# Patient Record
Sex: Female | Born: 1935 | Race: White | Hispanic: No | Smoking: Former smoker
Health system: Southern US, Community
[De-identification: ages and names within clinical notes are randomized; demographics above are authoritative.]

## PROBLEM LIST (undated history)

## (undated) DIAGNOSIS — E215 Disorder of parathyroid gland, unspecified: Secondary | ICD-10-CM

## (undated) DIAGNOSIS — M109 Gout, unspecified: Secondary | ICD-10-CM

## (undated) DIAGNOSIS — R011 Cardiac murmur, unspecified: Secondary | ICD-10-CM

## (undated) DIAGNOSIS — R0602 Shortness of breath: Secondary | ICD-10-CM

## (undated) DIAGNOSIS — Z9089 Acquired absence of other organs: Secondary | ICD-10-CM

## (undated) DIAGNOSIS — Z9049 Acquired absence of other specified parts of digestive tract: Secondary | ICD-10-CM

## (undated) DIAGNOSIS — I10 Essential (primary) hypertension: Secondary | ICD-10-CM

## (undated) DIAGNOSIS — N816 Rectocele: Secondary | ICD-10-CM

## (undated) DIAGNOSIS — Z8719 Personal history of other diseases of the digestive system: Secondary | ICD-10-CM

## (undated) DIAGNOSIS — Z905 Acquired absence of kidney: Secondary | ICD-10-CM

## (undated) DIAGNOSIS — Z992 Dependence on renal dialysis: Secondary | ICD-10-CM

## (undated) DIAGNOSIS — L988 Other specified disorders of the skin and subcutaneous tissue: Secondary | ICD-10-CM

## (undated) DIAGNOSIS — N186 End stage renal disease: Secondary | ICD-10-CM

## (undated) DIAGNOSIS — Z90710 Acquired absence of both cervix and uterus: Secondary | ICD-10-CM

## (undated) DIAGNOSIS — K219 Gastro-esophageal reflux disease without esophagitis: Secondary | ICD-10-CM

## (undated) DIAGNOSIS — C801 Malignant (primary) neoplasm, unspecified: Secondary | ICD-10-CM

## (undated) HISTORY — DX: Other specified disorders of the skin and subcutaneous tissue: L98.8

## (undated) HISTORY — DX: Rectocele: N81.6

## (undated) HISTORY — PX: DG AV DIALYSIS GRAFT DECLOT OR: HXRAD813

## (undated) HISTORY — DX: Acquired absence of other organs: Z90.89

## (undated) HISTORY — PX: RECTOCELE REPAIR: SHX761

## (undated) HISTORY — DX: Acquired absence of other specified parts of digestive tract: Z90.49

## (undated) HISTORY — PX: TONSILLECTOMY: SUR1361

## (undated) HISTORY — DX: Dependence on renal dialysis: Z99.2

## (undated) HISTORY — DX: End stage renal disease: N18.6

## (undated) HISTORY — DX: Acquired absence of kidney: Z90.5

## (undated) HISTORY — DX: Essential (primary) hypertension: I10

## (undated) HISTORY — DX: Disorder of parathyroid gland, unspecified: E21.5

## (undated) HISTORY — DX: Gastro-esophageal reflux disease without esophagitis: K21.9

## (undated) HISTORY — DX: Acquired absence of both cervix and uterus: Z90.710

## (undated) HISTORY — PX: EYE SURGERY: SHX253

---
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
## 1988-09-13 HISTORY — PX: ABDOMINAL HYSTERECTOMY: SHX81

## 1997-12-12 ENCOUNTER — Other Ambulatory Visit: Admission: RE | Admit: 1997-12-12 | Discharge: 1997-12-12 | Payer: Self-pay | Admitting: Nephrology

## 1997-12-19 ENCOUNTER — Ambulatory Visit (HOSPITAL_COMMUNITY): Admission: RE | Admit: 1997-12-19 | Discharge: 1997-12-19 | Payer: Self-pay | Admitting: Nephrology

## 1998-01-07 ENCOUNTER — Other Ambulatory Visit: Admission: RE | Admit: 1998-01-07 | Discharge: 1998-01-07 | Payer: Self-pay | Admitting: Nephrology

## 1998-01-09 ENCOUNTER — Other Ambulatory Visit: Admission: RE | Admit: 1998-01-09 | Discharge: 1998-01-09 | Payer: Self-pay | Admitting: Nephrology

## 1998-01-09 ENCOUNTER — Ambulatory Visit (HOSPITAL_COMMUNITY): Admission: RE | Admit: 1998-01-09 | Discharge: 1998-01-09 | Payer: Self-pay | Admitting: Nephrology

## 1998-02-03 ENCOUNTER — Ambulatory Visit (HOSPITAL_COMMUNITY): Admission: RE | Admit: 1998-02-03 | Discharge: 1998-02-03 | Payer: Self-pay | Admitting: Nephrology

## 2001-01-27 ENCOUNTER — Encounter: Payer: Self-pay | Admitting: Nephrology

## 2001-01-27 ENCOUNTER — Ambulatory Visit (HOSPITAL_COMMUNITY): Admission: RE | Admit: 2001-01-27 | Discharge: 2001-01-27 | Payer: Self-pay | Admitting: Nephrology

## 2001-12-06 ENCOUNTER — Other Ambulatory Visit: Admission: RE | Admit: 2001-12-06 | Discharge: 2001-12-06 | Payer: Self-pay | Admitting: Obstetrics and Gynecology

## 2002-02-13 ENCOUNTER — Ambulatory Visit (HOSPITAL_COMMUNITY): Admission: RE | Admit: 2002-02-13 | Discharge: 2002-02-13 | Payer: Self-pay | Admitting: Internal Medicine

## 2002-04-05 ENCOUNTER — Ambulatory Visit (HOSPITAL_COMMUNITY): Admission: RE | Admit: 2002-04-05 | Discharge: 2002-04-05 | Payer: Self-pay | Admitting: Family Medicine

## 2002-04-05 ENCOUNTER — Encounter: Payer: Self-pay | Admitting: Family Medicine

## 2002-04-12 ENCOUNTER — Ambulatory Visit (HOSPITAL_COMMUNITY): Admission: RE | Admit: 2002-04-12 | Discharge: 2002-04-12 | Payer: Self-pay | Admitting: Family Medicine

## 2002-04-12 ENCOUNTER — Encounter: Payer: Self-pay | Admitting: Family Medicine

## 2002-04-17 ENCOUNTER — Ambulatory Visit (HOSPITAL_COMMUNITY): Admission: RE | Admit: 2002-04-17 | Discharge: 2002-04-17 | Payer: Self-pay | Admitting: Family Medicine

## 2002-04-17 ENCOUNTER — Encounter: Payer: Self-pay | Admitting: Family Medicine

## 2002-05-31 ENCOUNTER — Encounter (HOSPITAL_COMMUNITY): Admission: RE | Admit: 2002-05-31 | Discharge: 2002-06-30 | Payer: Self-pay | Admitting: Family Medicine

## 2002-06-07 ENCOUNTER — Other Ambulatory Visit: Admission: RE | Admit: 2002-06-07 | Discharge: 2002-06-07 | Payer: Self-pay | Admitting: Dermatology

## 2002-07-26 ENCOUNTER — Encounter (HOSPITAL_COMMUNITY): Admission: RE | Admit: 2002-07-26 | Discharge: 2002-08-25 | Payer: Self-pay | Admitting: Family Medicine

## 2002-09-24 ENCOUNTER — Encounter (HOSPITAL_COMMUNITY): Admission: RE | Admit: 2002-09-24 | Discharge: 2002-10-24 | Payer: Self-pay | Admitting: Family Medicine

## 2002-11-19 ENCOUNTER — Encounter (HOSPITAL_COMMUNITY): Admission: RE | Admit: 2002-11-19 | Discharge: 2002-12-19 | Payer: Self-pay | Admitting: Family Medicine

## 2003-01-14 ENCOUNTER — Encounter (HOSPITAL_COMMUNITY): Admission: RE | Admit: 2003-01-14 | Discharge: 2003-02-13 | Payer: Self-pay | Admitting: Family Medicine

## 2003-03-11 ENCOUNTER — Encounter (HOSPITAL_COMMUNITY): Admission: RE | Admit: 2003-03-11 | Discharge: 2003-04-10 | Payer: Self-pay | Admitting: Nephrology

## 2003-05-06 ENCOUNTER — Encounter (HOSPITAL_COMMUNITY): Admission: RE | Admit: 2003-05-06 | Discharge: 2003-06-05 | Payer: Self-pay | Admitting: Nephrology

## 2003-07-01 ENCOUNTER — Encounter (HOSPITAL_COMMUNITY): Admission: RE | Admit: 2003-07-01 | Discharge: 2003-07-31 | Payer: Self-pay | Admitting: Nephrology

## 2003-12-16 ENCOUNTER — Ambulatory Visit (HOSPITAL_COMMUNITY): Admission: RE | Admit: 2003-12-16 | Discharge: 2003-12-16 | Payer: Self-pay | Admitting: Vascular Surgery

## 2003-12-31 ENCOUNTER — Encounter (HOSPITAL_COMMUNITY): Admission: RE | Admit: 2003-12-31 | Discharge: 2004-01-30 | Payer: Self-pay | Admitting: Nephrology

## 2004-04-22 ENCOUNTER — Ambulatory Visit (HOSPITAL_COMMUNITY): Admission: RE | Admit: 2004-04-22 | Discharge: 2004-04-22 | Payer: Self-pay | Admitting: Family Medicine

## 2004-08-31 ENCOUNTER — Inpatient Hospital Stay (HOSPITAL_COMMUNITY): Admission: AD | Admit: 2004-08-31 | Discharge: 2004-09-02 | Payer: Self-pay | Admitting: Family Medicine

## 2004-09-01 ENCOUNTER — Ambulatory Visit: Payer: Self-pay | Admitting: Cardiology

## 2005-07-23 ENCOUNTER — Ambulatory Visit (HOSPITAL_COMMUNITY): Admission: RE | Admit: 2005-07-23 | Discharge: 2005-07-23 | Payer: Self-pay | Admitting: Nephrology

## 2006-03-02 ENCOUNTER — Ambulatory Visit: Payer: Self-pay | Admitting: Internal Medicine

## 2006-03-11 ENCOUNTER — Ambulatory Visit: Payer: Self-pay

## 2006-03-11 ENCOUNTER — Encounter: Payer: Self-pay | Admitting: Cardiology

## 2006-03-11 ENCOUNTER — Ambulatory Visit: Payer: Self-pay | Admitting: Cardiology

## 2006-04-08 ENCOUNTER — Ambulatory Visit (HOSPITAL_COMMUNITY): Admission: RE | Admit: 2006-04-08 | Discharge: 2006-04-08 | Payer: Self-pay | Admitting: Nephrology

## 2006-07-11 ENCOUNTER — Ambulatory Visit (HOSPITAL_COMMUNITY): Admission: RE | Admit: 2006-07-11 | Discharge: 2006-07-11 | Payer: Self-pay | Admitting: Neurology

## 2006-07-25 ENCOUNTER — Ambulatory Visit (HOSPITAL_COMMUNITY): Admission: RE | Admit: 2006-07-25 | Discharge: 2006-07-25 | Payer: Self-pay | Admitting: Nephrology

## 2006-09-13 HISTORY — PX: CHOLECYSTECTOMY: SHX55

## 2006-10-12 ENCOUNTER — Ambulatory Visit (HOSPITAL_COMMUNITY): Admission: RE | Admit: 2006-10-12 | Discharge: 2006-10-12 | Payer: Self-pay | Admitting: Nephrology

## 2006-12-16 ENCOUNTER — Ambulatory Visit: Payer: Self-pay | Admitting: Internal Medicine

## 2007-01-09 ENCOUNTER — Encounter: Payer: Self-pay | Admitting: Internal Medicine

## 2007-01-09 ENCOUNTER — Ambulatory Visit (HOSPITAL_COMMUNITY): Admission: RE | Admit: 2007-01-09 | Discharge: 2007-01-09 | Payer: Self-pay | Admitting: Internal Medicine

## 2007-02-11 ENCOUNTER — Ambulatory Visit (HOSPITAL_COMMUNITY): Admission: RE | Admit: 2007-02-11 | Discharge: 2007-02-11 | Payer: Self-pay | Admitting: Interventional Radiology

## 2007-02-14 ENCOUNTER — Ambulatory Visit (HOSPITAL_COMMUNITY): Admission: RE | Admit: 2007-02-14 | Discharge: 2007-02-14 | Payer: Self-pay | Admitting: Nephrology

## 2007-03-08 ENCOUNTER — Ambulatory Visit (HOSPITAL_COMMUNITY): Admission: RE | Admit: 2007-03-08 | Discharge: 2007-03-08 | Payer: Self-pay | Admitting: Urology

## 2007-05-05 ENCOUNTER — Ambulatory Visit: Payer: Self-pay | Admitting: Vascular Surgery

## 2007-06-07 ENCOUNTER — Encounter: Payer: Self-pay | Admitting: Internal Medicine

## 2008-03-07 ENCOUNTER — Ambulatory Visit (HOSPITAL_COMMUNITY): Admission: RE | Admit: 2008-03-07 | Discharge: 2008-03-07 | Payer: Self-pay | Admitting: Surgery

## 2008-05-09 ENCOUNTER — Encounter: Payer: Self-pay | Admitting: Internal Medicine

## 2008-05-16 ENCOUNTER — Encounter: Payer: Self-pay | Admitting: Internal Medicine

## 2008-05-23 ENCOUNTER — Encounter: Payer: Self-pay | Admitting: Internal Medicine

## 2008-05-27 ENCOUNTER — Encounter: Payer: Self-pay | Admitting: Internal Medicine

## 2008-06-10 ENCOUNTER — Encounter: Payer: Self-pay | Admitting: Internal Medicine

## 2008-08-01 ENCOUNTER — Encounter: Payer: Self-pay | Admitting: Internal Medicine

## 2008-08-07 ENCOUNTER — Ambulatory Visit (HOSPITAL_COMMUNITY): Admission: RE | Admit: 2008-08-07 | Discharge: 2008-08-07 | Payer: Self-pay | Admitting: Nephrology

## 2008-08-08 ENCOUNTER — Ambulatory Visit (HOSPITAL_COMMUNITY): Admission: RE | Admit: 2008-08-08 | Discharge: 2008-08-08 | Payer: Self-pay | Admitting: Nephrology

## 2008-08-23 ENCOUNTER — Ambulatory Visit: Payer: Self-pay | Admitting: Vascular Surgery

## 2008-08-28 ENCOUNTER — Ambulatory Visit (HOSPITAL_COMMUNITY): Admission: RE | Admit: 2008-08-28 | Discharge: 2008-08-28 | Payer: Self-pay | Admitting: Vascular Surgery

## 2008-08-28 ENCOUNTER — Ambulatory Visit: Payer: Self-pay | Admitting: Vascular Surgery

## 2008-08-29 ENCOUNTER — Encounter: Payer: Self-pay | Admitting: Internal Medicine

## 2008-09-14 ENCOUNTER — Encounter: Payer: Self-pay | Admitting: Internal Medicine

## 2008-09-26 ENCOUNTER — Telehealth: Payer: Self-pay | Admitting: Internal Medicine

## 2008-10-07 ENCOUNTER — Ambulatory Visit: Payer: Self-pay | Admitting: Internal Medicine

## 2008-10-07 DIAGNOSIS — R112 Nausea with vomiting, unspecified: Secondary | ICD-10-CM

## 2008-10-07 DIAGNOSIS — R1115 Cyclical vomiting syndrome unrelated to migraine: Secondary | ICD-10-CM

## 2008-10-09 ENCOUNTER — Ambulatory Visit (HOSPITAL_COMMUNITY): Admission: RE | Admit: 2008-10-09 | Discharge: 2008-10-09 | Payer: Self-pay | Admitting: Nephrology

## 2008-10-28 ENCOUNTER — Encounter (HOSPITAL_COMMUNITY): Admission: RE | Admit: 2008-10-28 | Discharge: 2008-11-27 | Payer: Self-pay | Admitting: Internal Medicine

## 2008-12-19 ENCOUNTER — Ambulatory Visit: Payer: Self-pay | Admitting: Cardiology

## 2008-12-27 ENCOUNTER — Ambulatory Visit: Payer: Self-pay | Admitting: Cardiology

## 2008-12-27 ENCOUNTER — Encounter: Payer: Self-pay | Admitting: Cardiology

## 2008-12-27 ENCOUNTER — Encounter (HOSPITAL_COMMUNITY): Admission: RE | Admit: 2008-12-27 | Discharge: 2009-01-26 | Payer: Self-pay | Admitting: Cardiology

## 2008-12-28 ENCOUNTER — Ambulatory Visit (HOSPITAL_COMMUNITY): Admission: RE | Admit: 2008-12-28 | Discharge: 2008-12-28 | Payer: Self-pay | Admitting: Vascular Surgery

## 2008-12-28 ENCOUNTER — Ambulatory Visit: Payer: Self-pay | Admitting: Vascular Surgery

## 2009-01-08 ENCOUNTER — Ambulatory Visit: Payer: Self-pay | Admitting: Cardiology

## 2009-01-13 ENCOUNTER — Ambulatory Visit (HOSPITAL_COMMUNITY): Admission: RE | Admit: 2009-01-13 | Discharge: 2009-01-13 | Payer: Self-pay | Admitting: Family Medicine

## 2009-01-22 ENCOUNTER — Ambulatory Visit (HOSPITAL_COMMUNITY): Admission: RE | Admit: 2009-01-22 | Discharge: 2009-01-22 | Payer: Self-pay | Admitting: Family Medicine

## 2009-01-24 ENCOUNTER — Ambulatory Visit: Payer: Self-pay | Admitting: Internal Medicine

## 2009-01-24 DIAGNOSIS — K589 Irritable bowel syndrome without diarrhea: Secondary | ICD-10-CM

## 2009-02-19 ENCOUNTER — Encounter: Payer: Self-pay | Admitting: Internal Medicine

## 2009-04-23 ENCOUNTER — Encounter (HOSPITAL_COMMUNITY): Admission: RE | Admit: 2009-04-23 | Discharge: 2009-05-23 | Payer: Self-pay | Admitting: Family Medicine

## 2009-05-07 ENCOUNTER — Encounter (INDEPENDENT_AMBULATORY_CARE_PROVIDER_SITE_OTHER): Payer: Self-pay | Admitting: General Surgery

## 2009-05-08 ENCOUNTER — Inpatient Hospital Stay (HOSPITAL_COMMUNITY): Admission: RE | Admit: 2009-05-08 | Discharge: 2009-05-10 | Payer: Self-pay | Admitting: General Surgery

## 2009-07-07 HISTORY — PX: PARATHYROIDECTOMY: SHX19

## 2009-08-13 ENCOUNTER — Ambulatory Visit (HOSPITAL_COMMUNITY): Admission: RE | Admit: 2009-08-13 | Discharge: 2009-08-13 | Payer: Self-pay | Admitting: Nephrology

## 2009-08-20 ENCOUNTER — Ambulatory Visit (HOSPITAL_COMMUNITY): Admission: RE | Admit: 2009-08-20 | Discharge: 2009-08-20 | Payer: Self-pay | Admitting: Family Medicine

## 2010-06-22 ENCOUNTER — Ambulatory Visit: Payer: Self-pay | Admitting: Internal Medicine

## 2010-09-01 ENCOUNTER — Ambulatory Visit (HOSPITAL_COMMUNITY)
Admission: RE | Admit: 2010-09-01 | Discharge: 2010-09-01 | Payer: Self-pay | Source: Home / Self Care | Attending: Family Medicine | Admitting: Family Medicine

## 2010-10-04 ENCOUNTER — Encounter: Payer: Self-pay | Admitting: Nephrology

## 2010-11-26 ENCOUNTER — Ambulatory Visit (INDEPENDENT_AMBULATORY_CARE_PROVIDER_SITE_OTHER): Payer: Medicare Other | Admitting: Otolaryngology

## 2010-11-26 DIAGNOSIS — H814 Vertigo of central origin: Secondary | ICD-10-CM

## 2010-11-26 DIAGNOSIS — R42 Dizziness and giddiness: Secondary | ICD-10-CM

## 2010-11-26 DIAGNOSIS — H6120 Impacted cerumen, unspecified ear: Secondary | ICD-10-CM

## 2010-12-19 LAB — POCT I-STAT 4, (NA,K, GLUC, HGB,HCT)
HCT: 31 % — ABNORMAL LOW (ref 36.0–46.0)
Hemoglobin: 10.5 g/dL — ABNORMAL LOW (ref 12.0–15.0)

## 2010-12-19 LAB — RENAL FUNCTION PANEL
Albumin: 3 g/dL — ABNORMAL LOW (ref 3.5–5.2)
BUN: 28 mg/dL — ABNORMAL HIGH (ref 6–23)
BUN: 31 mg/dL — ABNORMAL HIGH (ref 6–23)
BUN: 32 mg/dL — ABNORMAL HIGH (ref 6–23)
BUN: 36 mg/dL — ABNORMAL HIGH (ref 6–23)
BUN: 37 mg/dL — ABNORMAL HIGH (ref 6–23)
CO2: 23 mEq/L (ref 19–32)
CO2: 25 mEq/L (ref 19–32)
CO2: 25 mEq/L (ref 19–32)
CO2: 28 mEq/L (ref 19–32)
Calcium: 6.6 mg/dL — ABNORMAL LOW (ref 8.4–10.5)
Calcium: 8 mg/dL — ABNORMAL LOW (ref 8.4–10.5)
Calcium: 8 mg/dL — ABNORMAL LOW (ref 8.4–10.5)
Chloride: 95 mEq/L — ABNORMAL LOW (ref 96–112)
Chloride: 96 mEq/L (ref 96–112)
Chloride: 97 mEq/L (ref 96–112)
Chloride: 99 mEq/L (ref 96–112)
Creatinine, Ser: 4.86 mg/dL — ABNORMAL HIGH (ref 0.4–1.2)
Creatinine, Ser: 6.45 mg/dL — ABNORMAL HIGH (ref 0.4–1.2)
Creatinine, Ser: 8.01 mg/dL — ABNORMAL HIGH (ref 0.4–1.2)
Creatinine, Ser: 8.18 mg/dL — ABNORMAL HIGH (ref 0.4–1.2)
Glucose, Bld: 100 mg/dL — ABNORMAL HIGH (ref 70–99)
Glucose, Bld: 116 mg/dL — ABNORMAL HIGH (ref 70–99)
Glucose, Bld: 132 mg/dL — ABNORMAL HIGH (ref 70–99)
Glucose, Bld: 144 mg/dL — ABNORMAL HIGH (ref 70–99)
Glucose, Bld: 98 mg/dL (ref 70–99)
Phosphorus: 2.8 mg/dL (ref 2.3–4.6)
Phosphorus: 5.1 mg/dL — ABNORMAL HIGH (ref 2.3–4.6)
Potassium: 4.4 mEq/L (ref 3.5–5.1)
Potassium: 4.5 mEq/L (ref 3.5–5.1)
Sodium: 132 mEq/L — ABNORMAL LOW (ref 135–145)
Sodium: 135 mEq/L (ref 135–145)

## 2010-12-19 LAB — CBC
HCT: 24.5 % — ABNORMAL LOW (ref 36.0–46.0)
HCT: 25.5 % — ABNORMAL LOW (ref 36.0–46.0)
HCT: 25.7 % — ABNORMAL LOW (ref 36.0–46.0)
HCT: 34 % — ABNORMAL LOW (ref 36.0–46.0)
Hemoglobin: 8.7 g/dL — ABNORMAL LOW (ref 12.0–15.0)
MCHC: 33.7 g/dL (ref 30.0–36.0)
MCHC: 33.9 g/dL (ref 30.0–36.0)
MCHC: 34.4 g/dL (ref 30.0–36.0)
MCV: 95.2 fL (ref 78.0–100.0)
MCV: 95.6 fL (ref 78.0–100.0)
MCV: 96 fL (ref 78.0–100.0)
Platelets: 110 10*3/uL — ABNORMAL LOW (ref 150–400)
Platelets: 116 10*3/uL — ABNORMAL LOW (ref 150–400)
RBC: 2.67 MIL/uL — ABNORMAL LOW (ref 3.87–5.11)
RBC: 3.24 MIL/uL — ABNORMAL LOW (ref 3.87–5.11)
RBC: 3.57 MIL/uL — ABNORMAL LOW (ref 3.87–5.11)
RDW: 15.7 % — ABNORMAL HIGH (ref 11.5–15.5)
RDW: 15.9 % — ABNORMAL HIGH (ref 11.5–15.5)
WBC: 13.1 10*3/uL — ABNORMAL HIGH (ref 4.0–10.5)
WBC: 7.6 10*3/uL (ref 4.0–10.5)
WBC: 8.1 10*3/uL (ref 4.0–10.5)
WBC: 8.7 10*3/uL (ref 4.0–10.5)

## 2010-12-19 LAB — COMPREHENSIVE METABOLIC PANEL
ALT: 54 U/L — ABNORMAL HIGH (ref 0–35)
AST: 45 U/L — ABNORMAL HIGH (ref 0–37)
CO2: 24 mEq/L (ref 19–32)
Calcium: 8 mg/dL — ABNORMAL LOW (ref 8.4–10.5)
Chloride: 99 mEq/L (ref 96–112)
GFR calc Af Amer: 6 mL/min — ABNORMAL LOW (ref >60)
GFR calc non Af Amer: 5 mL/min — ABNORMAL LOW (ref >60)
Potassium: 5.6 mEq/L — ABNORMAL HIGH (ref 3.5–5.1)
Sodium: 135 mEq/L (ref 135–145)
Total Bilirubin: 0.9 mg/dL (ref 0.3–1.2)

## 2010-12-19 LAB — BASIC METABOLIC PANEL
BUN: 29 mg/dL — ABNORMAL HIGH (ref 6–23)
CO2: 27 mEq/L (ref 19–32)
Chloride: 101 mEq/L (ref 96–112)
GFR calc Af Amer: 7 mL/min — ABNORMAL LOW (ref >60)
Potassium: 4.9 mEq/L (ref 3.5–5.1)

## 2010-12-19 LAB — DIFFERENTIAL
Eosinophils Absolute: 0.2 10*3/uL (ref 0.0–0.7)
Eosinophils Relative: 2 % (ref 0–5)
Lymphs Abs: 1.8 10*3/uL (ref 0.7–4.0)
Monocytes Relative: 8 % (ref 3–12)

## 2010-12-19 LAB — GLUCOSE, CAPILLARY: Glucose-Capillary: 120 mg/dL — ABNORMAL HIGH (ref 70–99)

## 2010-12-19 LAB — HEPATITIS B SURFACE ANTIGEN: Hepatitis B Surface Ag: NEGATIVE

## 2010-12-23 LAB — POCT I-STAT 4, (NA,K, GLUC, HGB,HCT)
Glucose, Bld: 89 mg/dL (ref 70–99)
HCT: 44 % (ref 36.0–46.0)
Potassium: 4.7 mEq/L (ref 3.5–5.1)
Sodium: 134 mEq/L — ABNORMAL LOW (ref 135–145)

## 2010-12-25 ENCOUNTER — Other Ambulatory Visit (HOSPITAL_COMMUNITY): Payer: Self-pay | Admitting: Nephrology

## 2010-12-25 DIAGNOSIS — N186 End stage renal disease: Secondary | ICD-10-CM

## 2010-12-26 ENCOUNTER — Other Ambulatory Visit: Payer: Self-pay | Admitting: Nephrology

## 2010-12-26 ENCOUNTER — Ambulatory Visit (HOSPITAL_COMMUNITY)
Admission: RE | Admit: 2010-12-26 | Discharge: 2010-12-26 | Disposition: A | Discharge status: Home / Self Care | Payer: Medicare Other | Source: Ambulatory Visit | Attending: Nephrology | Admitting: Nephrology

## 2010-12-26 DIAGNOSIS — T8249XA Other complication of vascular dialysis catheter, initial encounter: Secondary | ICD-10-CM

## 2010-12-26 DIAGNOSIS — T82898A Other specified complication of vascular prosthetic devices, implants and grafts, initial encounter: Secondary | ICD-10-CM | POA: Insufficient documentation

## 2010-12-26 DIAGNOSIS — N186 End stage renal disease: Secondary | ICD-10-CM | POA: Insufficient documentation

## 2010-12-26 DIAGNOSIS — Y832 Surgical operation with anastomosis, bypass or graft as the cause of abnormal reaction of the patient, or of later complication, without mention of misadventure at the time of the procedure: Secondary | ICD-10-CM | POA: Insufficient documentation

## 2010-12-26 MED ORDER — IOHEXOL 300 MG/ML  SOLN
100.0000 mL | Freq: Once | INTRAMUSCULAR | Status: AC | PRN
  Administered 2010-12-26: 75 mL via INTRAVENOUS

## 2011-01-04 ENCOUNTER — Ambulatory Visit (HOSPITAL_COMMUNITY): Payer: Medicare Other

## 2011-01-26 NOTE — Discharge Summary (Signed)
NAMEABRYANA, Julia Love                ACCOUNT NO.:  [id number]   MEDICAL RECORD NO.:  [id number]           PATIENT TYPE:   LOCATION:                                 FACILITY:   PHYSICIAN:  Anselm Pancoast. Weatherly, M.D.DATE OF BIRTH:  1928-12-18   DATE OF ADMISSION:  05/06/2009  DATE OF DISCHARGE:  05/08/2009                               DISCHARGE SUMMARY   DISCHARGE DIAGNOSES:  1. Chronic renal failure.  2. Secondary hyperparathyroidism.   OPERATION:  Total parathyroidectomy and transplant to the left forearm,  general anesthesia, also hemodialysis.   HISTORY:  Julia Love is a 77 year old Caucasian female who is dialyzed  in [place], and she was referred to me by Dr. Caryn Section or Dr. Eliott Nine for  secondary hyperparathyroidism and for a parathyroidectomy and  transplant.  The patient is dialyzed in [place].  She lives in [place]  in the [facility], and her PTH level was about 1030 in  July 2010.  The patient is dialyzed on Tuesday, Thursday, and Saturday  and was admitted for surgery on Friday.   CHRONIC MEDICATIONS:  She is on:  1. Allopurinol 100 mg p.r.n.  2. Bentyl 10 mg p.r.n.  3. Fosrenol 2 tablets t.i.d. with meals.  4. Lisinopril 40 mg daily.  5. Spiriva 1 inhalation daily.  6. Prilosec 20.  7. Chlorzoxazone 500 mg p.r.n. spasm.   The patient lives by herself but accompanied by her daughter and was  taken to surgery where I did a neck exploration.  Dr. Donell Beers assisted  and ultimately 5 parathyroid glands were found, 3 on the left, 2 on the  right.  There was 1 little nodule that looked like a parathyroid to [place].  It was actually thyroid tissue and there was another little firm nodule  in the thyroid.  These were removed and were just benign thyroid tissue.  I transplanted 10 little pieces to the left forearm through a separate  incision, and postoperatively the patient's calcium dropped to  approximately 7 on the morning following her surgery and then over  the  next 24 hours the calcium started rising the low being 6.8 on the  morning following her surgery, and she desired to stay in the hospital  over the weekend mainly because of her age of 54 and was seen by the  hemodialysis doctors and then was ready for discharge after her  hemodialysis on Monday morning.  Her incision appears to be healing  nicely, and her serum calcium was 7 and stable on the morning of  surgery, and she will continue on her chronic medications and is  scheduled for her next hemodialysis on  Wednesday at the [facility].  She was taking Darvocet-N 100 for  her pain, and the staples will be removed from her forearm in the office  in approximately 1 week.  Her incision is healing nicely, and she had  good voice.  No evidence of any significant hematoma or wound problems  at the time of her discharge.      Anselm Pancoast. Zachery Dakins, M.D.  Electronically Signed  WJW/MEDQ  D:  05/28/2009  T:  05/29/2009  Job:  [id number]

## 2011-01-26 NOTE — Assessment & Plan Note (Signed)
[facility]                       [place] CARDIOLOGY OFFICE NOTE   LAMIRACLE, CHAIDEZ                       MRN:          [id number]  DATE:01/08/2009                            DOB:          May 02, 1929    Ms. Julia Love comes back in today for generalized dizziness, weakness, and  fatigue.   She had had some dyspnea on exertion as well.   We performed an adenosine Myoview which showed an EF of 43% with no  ischemia.  She had mild hypokinesia of the lateral and inferior wall.  There was some breast attenuation.   She also had a 2-D echocardiogram which shows diastolic dysfunction with  moderate LVH, EF is 60-65%.  There was no significant valvular heart  disease.  Right-sided function appeared normal.  There was no evidence  of pulmonary hypertension.  There was no pericardial effusion.   She has significant problems of dropping her blood pressure with  dialysis.  She and her daughter confirms this today.  She holds her  medicines prior to dialysis.   Her meds are unchanged since her last visit.  Please refer to my initial  note as well as the maintenance medication list.   Blood pressure today is 148/76, pulse is 60 and regular, her weight is  157.  Neck shows no JVD.  Lungs were clear to auscultation and  percussion.  Heart reveals a regular rate and rhythm.  Abdominal exam is  soft.  Extremities, there were no significant edema.   ASSESSMENT AND PLAN:  1. Chronic diastolic congestive heart failure.  2. Hypertension with tendency to drop her pressure with dialysis.   At this point, I do not think increasing her medications would be of any  significant benefit and actually might be deleterious dropping her  pressure further.  She does complain of some orthostatic symptoms,  particularly after dialysis and even today after dialysis.  At this  point in time, I have made no changes.  We will plan on seeing her back  again on a p.r.n.  basis.     Thomas C. Daleen Squibb, MD, [facility]  Electronically Signed    TCW/MedQ  DD: 01/08/2009  DT: 01/08/2009  Job #: [id number]   cc:   Donna Bernard, M.D.

## 2011-01-26 NOTE — Consult Note (Signed)
Julia Love, Julia Love                ACCOUNT NO.:  [id number]   MEDICAL RECORD NO.:  [id number]          PATIENT TYPE:  REC   LOCATION:  REH                           FACILITY:  [facility]   PHYSICIAN:  James L. Deterding, M.D.DATE OF BIRTH:  June 03, 1929   DATE OF CONSULTATION:  05/07/2009  DATE OF DISCHARGE:                                 CONSULTATION   REFERRING PHYSICIAN:  Scott A. Gerda Diss, MD.   REASON FOR CONSULTATION:  Postoperative management and hemodialysis.   HISTORY OF PRESENT ILLNESS:  This is a 77 year old white female with end-  stage renal disease on hemodialysis who was admitted for scheduled  parathyroidectomy for secondary hyperparathyroid hormone due to end-  stage renal disease.  Per home records the patient's last PTH was 1030.7  on March 31, 2009 with calcium 10.2, phosphorus 4.5.  The patient is  dialyzed on Tuesday, Thursday, Saturday at [facility].   PAST MEDICAL HISTORY:  1. End-stage renal disease secondary to hypertension on hemodialysis      since November 2005.  2. Solitary left kidney with cystic changes.  3. Glucose intolerance, diet controlled.  4. Hypertension.  5. Peripheral vascular disease.  6. Gout.  7. Secondary hyperparathyroidism.  8. Anemia.  9. Vertigo.  10.History of uterine cancer.  11.COPD.  12.Diastolic CHF with EF 60-65%.   PAST SURGICAL HISTORY:  Right forearm graft August 22, 2008 with  revision on December 28, 2008.   SOCIAL HISTORY:  The patient lives by herself.  She has a 20 pack-year  smoking history.  She quit smoking 20 years ago.  The patient denies  alcohol or illicit drug use.   FAMILY HISTORY:  Noncontributory.   REVIEW OF SYSTEMS:  12 point review of systems negative.  Negative for  chest pain, abdominal pain, shortness of breath, paresthesias or spasms.   HOME MEDICATIONS:  1. Chlorzoxazone 500 mg p.r.n. spasm.  2. Prilosec 20 mg p.o. b.i.d.  3. Spiriva one inhalation daily.  4. Lisinopril 40 mg p.o.  daily.  5. Fosrenol 2 tablets with meals t.i.d., one tablet with snacks b.i.d.  6. Bentyl 10 mg p.r.n.  7. Allopurinol 100 mg p.r.n.  8. Meclizine 25 mg 1-2 tablets p.r.n.  9. Simvastatin 20 mg nightly.  10.Dialyvite once daily.  11.Aspirin 81 mg daily.  12.Amlodipine 5 mg daily.   PHYSICAL EXAMINATION:  VITAL SIGNS:  Temperature 98.2, heart rate 71,  blood pressure 102/71, respirations 22, O2 saturation 96% on room air.  GENERAL:  Alert and oriented x4.  No acute distress.Marland Kitchen  HEENT:  Patient speaking, swallowing without any difficulty.  Patient  with an anterior neck dressing status post thyroidectomy.  CARDIOVASCULAR:  Regular rate and rhythm.  No murmurs.  RESPIRATORY:  Respirations clear to auscultation bilaterally.  ABDOMEN:  Positive bowel sounds, soft, nondistended.  EXTREMITIES:  No edema.  Left arm in dressing, status post auto  implantation.  Patient with a right forearm graft which is patent.   LABORATORY STUDIES:  No labs.   ASSESSMENT/PLAN:  This is a 77 year old female admitted immediately  postoperatively status post parathyroidectomy with auto  implantation and  of parathyroid glands in the left forearm for secondary hyperparathyroid  hormone.  1. Postop day #0:  The patient is on Vicodin and Ancef  times one dose      per surgery.  We will monitor the patient's creatinine, monitor the      patient's potassium, calcium, magnesium now and obtain a renal      function panel every 12 hours to monitor for hypocalcemia.  We will      prophylactically increase the patient's calcium bath on      hemodialysis and add calcium binders to the patient's regimen.  2. End-stage renal disease on hemodialysis:  The patient is to get      hemodialysis as regularly scheduled on Tuesday, Thursday, Saturday.      We will continue the patient on Dialyvite  The patient usually on 3      hours at outpatient hemodialysis center but we will increase to      three and a half hours for  improved dialysis.  Would increase the      patient's calcium bath from 2 calcium prior to surgery to three and      a half calcium post surgically.  3. Secondary hyperparathyroidism.  The patient was on calcitriol 2 mcg      three times a week.  We will increase that now to 4 mcg three times      a week as she has post parathyroidectomy.  Fosrenol will be      discontinued and the patient will be changed to calcium acetate for      binders.  4. Hypertension.  The patient on lisinopril and amlodipine at home.      These will be held if the patient's blood pressure is currently low      postoperatively.  5. Gout.  The patient will be continued on home allopurinol.  6. Chronic obstructive pulmonary disease.  The patient will be      continued on Spiriva inhaler.  7. Vertigo.  We will currently hold the patient's meclizine unless      needed.  8. Anemia secondary to end-stage renal disease:  The patient on Epogen      5.4000 units three times a week on hemodialysis.  We will monitor      CBC for postoperative blood loss.  9. Glucose intolerance.  Patient with history of neuropathy as a      complication.  Patient is currently diet controlled.      Delbert Harness, MD  Electronically Signed     ______________________________  Llana Aliment. Deterding, M.D.    Corky Downs  D:  05/07/2009  T:  05/07/2009  Job:  [id number]

## 2011-01-26 NOTE — Assessment & Plan Note (Signed)
[facility]                       [place] CARDIOLOGY OFFICE NOTE   TANZANIA, BASHAM                       MRN:          [id number]  DATE:12/19/2008                            DOB:          [birth year]    Ms. Kutzer comes in today for evaluation of extreme fatigue and  shortness of breath.   She is now 77 years of age.  I last saw her in June 2007, for  palpitations and a history of hypertension.   She has been on dialysis for renal failure for 5 years.  She says this  is really starting to take a toll on her.  She was actually exhausted to  the point she has to sleep on day she is dialyzed.   She denies any chest tightness or pressure, though just walking to the  mailbox exhausted.  She has also had some lightheadedness which she says  is there most of the time.  It is really not after dialysis and it is  really not when she stands up.   We heard this over the phone, we decreased her lisinopril to 40 mg a day  from 80 mg a day.  She continues on amlodipine to 2.5 mg p.o. b.i.d.,  aspirin 81 mg a day.  She continues on allopurinol 100 mg daily,  dicyclomine 10 mg b.i.d. Fosrenol 1000 mg before meals and bedtime,  simvastatin 20 mg nightly, Prilosec over-the-counter 20 mg per day.   Her blood pressure today is 150/60, her pulse is 66 and regular, and  weight is 156.  She was 165 back in 2007.  She is chronically ill appearing, she is very pleasant, soft spoken,  alert, and oriented x3.  HEENT:  Unremarkable.  NECK:  Supple.  Carotid upstrokes are equal bilaterally without bruits.  There is no JVD.  Thyroid is not enlarged.  Trachea is midline.  CHEST:  Lungs are clear to auscultation and percussion.  HEART:  Regular rate and rhythm.  No gallop or rub.  She does have a  systolic murmur along left sternal border.  S2 splits.  I could hear no  diastolic component.  There does not appear to be a right ventricular  lift.  ABDOMEN:  Soft.  Good  bowel sounds.  Nondistended.  EXTREMITIES:  No significant edema.  Pulses are intact.  NEUROLOGIC:  Intact.  SKIN:  Ecchymosis, particularly of the upper extremities.   Electrocardiogram showed some baseline artifact just from her tremor.  She has some PVCs that are unifocal, but no acute changes since we last  did an EKG on her.  She does have some poor R-wave progression.  However, now that she did not appear to have in the past.  It could be  lead placement.   ASSESSMENT:  Extreme fatigue and shortness of breath, rule out occult or  silent anterior wall infarct based on her EKG findings.  With her  history of hypertension and dialysis, she certainly is high risk for  having coronary artery disease.  In addition, she is at high risk of  having diastolic dysfunction with left ventricular hypertrophy  as well.   PLAN:  1. A 2-D echocardiogram.  2. Adenosine rest stress Myoview.  3. Sit down and talk to me afterwards in a couple of weeks.      Thomas C. Daleen Squibb, MD, [facility]  Electronically Signed    TCW/MedQ  DD: 12/19/2008  DT: 12/20/2008  Job #: [id number]   cc:   Donna Bernard, M.D.

## 2011-01-26 NOTE — Op Note (Signed)
NAMEMARIACHRISTINA, Julia Love                ACCOUNT NO.:  [id number]   MEDICAL RECORD NO.:  [id number]          PATIENT TYPE:  AMB   LOCATION:  DAY                          FACILITY:  [facility]   PHYSICIAN:  Excell Seltzer. Annabell Howells, M.D.    DATE OF BIRTH:  Sep 12, 1929   DATE OF PROCEDURE:  03/08/2007  DATE OF DISCHARGE:                               OPERATIVE REPORT   PROCEDURES:  1. Cystoscopy.  2. Bilateral retrograde pyelograms with interpretation.   PREOPERATIVE DIAGNOSES:  1. Gross hematuria.  2. End-stage renal disease   POSTOPERATIVE DIAGNOSES:  1. Gross hematuria.  2. End-stage renal disease.  3. Without obvious source of bleeding.   SURGEON:  Excell Seltzer. Annabell Howells, M.D.   ANESTHESIA:  General.   COMPLICATIONS:  None.   INDICATIONS:  [name] is a 77 year old white female with end-stage  renal disease on dialysis who had gross hematuria earlier this month.  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  A urine cytology was done, which was negative.  An ultrasound and CT  revealed a left renal cyst but no other abnormalities other than a  possible small stone within the left kidney.  It was felt that  cystoscopy and retrograde pyelography was indicated.   FINDINGS AND PROCEDURE:  The patient was taken to the operating room,  Cipro was given, a general anesthetic was induced.  She was placed in  lithotomy position.  Her perineum and genitalia were prepped with  Betadine solution and she was draped in the usual sterile fashion.  Cystoscopy was performed using a 22-French scope and 12 and 70-degrees  lenses.  Examination revealed a normal urethra with the exception of a  small benign polyp.  The bladder wall was smooth and pale with no tumor,  stones or inflammation noted.  Ureteral orifices were unremarkable.   The right ureteral orifice was cannulated with a 5-French open-end  catheter and contrast was instilled.   The retrograde pyelogram revealed an atretic  right ureter that blind-  ended just above the pelvis consistent with a prior nephrectomy.   The left ureteral orifice was cannulated with a 5-French open-end  catheter.  Contrast was instilled in a retrograde fashion.   This study revealed an unremarkable ureter.  The internal collecting  system was delicate without filling defects.  No abnormalities were  noted.   The bladder was drained.  The cystoscope was removed.  The patient was  taken down from the lithotomy position.  Her anesthetic was reversed.  She was moved to the recovery room in stable condition.  There were no  complications.      Excell Seltzer. Annabell Howells, M.D.  Electronically Signed     JJW/MEDQ  D:  03/08/2007  T:  03/08/2007  Job:  [id number]   cc:   Donna Bernard, M.D.  Fax: [phone]

## 2011-01-26 NOTE — Assessment & Plan Note (Signed)
OFFICE VISIT   Julia Love, Julia Love  DOB:  August 04, 1929                                       08/23/2008  [id number]#:[id number]   The patient presents today for evaluation of IV access.  She is a very  pleasant white female well known to me from placement of a prior left  upper arm AV fistula 5 years ago.  She had recently undergone attempted  thrombolysis and stenting of this at [facility].  She,  unfortunately, had early thrombosis.  I am seeing her today for  evaluation of skin changes over the distal portion of her aneurysm on  the left and for planning for new graft.  She does have a right IJ  Diatek catheter which is functioning without difficulty.  She,  unfortunately, has nausea and vomiting today related to what she has  been told from her gastroenterologist is a stomach infection.   PHYSICAL EXAMINATION:  Her left arm does have occlusion of the fistula  from her mid upper arm proximally.  She does have a large aneurysmal  change over the vein at the antecubital space.  There is an area  overlying the puncture site for the thrombolysis and stenting that does  have an area of irritation.  The aneurysm at the antecubital space is  quite pulsatile due to proximal occlusion.  There is no actual skin  breakdown over this area.   I ultrasounded her basilic vein on the left and it is quite small, on  the right she does not have adequate cephalic vein by ultrasound for  fistula creation but does have a good basilic vein on the medial aspect  above her elbow.  I discussed options with the patient.  I recommended  that she undergo placement of a right forearm loop graft and also  excision of the aneurysm at the antecubital space.  She dialyzes on  Tuesday, Thursday, Saturday and we have scheduled this for Dr. Hart Rochester on  08/28/2008 at [facility].   Larina Earthly, M.D.  Electronically Signed   TFE/MEDQ  D:  08/23/2008  T:  08/26/2008  Job:   [id number]   cc:   Mindi Slicker. Lowell Guitar, M.D.  D. Oley Balm III, M.D.

## 2011-01-26 NOTE — Assessment & Plan Note (Signed)
OFFICE VISIT   Julia Love, Julia Love  DOB:  1929/05/20                                       05/05/2007  [id number]#:[id number]   The patient presents today for evaluation of AV access.  She is known to  me from prior AV fistula creation on 12/16/2003.  She has had good use  of this and has had several episodes of thrombosis requiring  thrombolysis intervention.  She has never had a revision surgically of  this.  Her most recent thrombolysis was in May of 2008.  She has  dialyzed successfully since then.  She reports that she has had no  difficulty with her dialysis and has had good flows.  She is questioning  why she is here.  I did call the [facility] and  reportedly have been asked to place a new access for [name].  They  report that she has been having adequate flows through her fistula.   PHYSICAL EXAMINATION:  Well-developed, well-nourished white female  appearing stated age, 64.  Blood pressure 155/71, pulse 81, respirations  18.  She does have a well-functioning left upper arm AV fistula.  She  does have some venous aneurysmal change in the distal upper arm.  There  is no skin breakdown over this.  She does have good development in size  all the way up to her shoulder.   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  I had discussed this  with [name].  Since she is having adequate dialysis with good flows  I would recommend that she continue this current treatment.  If she  should have progressive difficulty with poor flow and function I would  recommend that she have a formal shuntogram to determine if she could  have a revision of the proximal portion of her vein.  I did explain the  option of mobilizing this cephalic vein and transposing it to the  axillary  vein.  Since she and the Dialysis Center report adequate flow I would  recommend continued use of this  fistula as long as possible.  She  understands this and will see [place] on an as-needed basis.   Larina Earthly, M.D.  Electronically Signed   TFE/MEDQ  D:  05/05/2007  T:  05/08/2007  Job:  [id number]   cc:   [facility]  [facility]

## 2011-01-26 NOTE — Op Note (Signed)
Julia Love, Julia Love                ACCOUNT NO.:  [id number]   MEDICAL RECORD NO.:  [id number]          PATIENT TYPE:  AMB   LOCATION:  SDS                          FACILITY:  [facility]   PHYSICIAN:  Q[place]a Skye. Hart Rochester, M.D.  DATE OF BIRTH:  January 04, 1929   DATE OF PROCEDURE:  08/28/2008  DATE OF DISCHARGE:  08/28/2008                               OPERATIVE REPORT   PREOPERATIVE DIAGNOSIS:  End-stage renal disease with large aneurysm,  left upper arm arteriovenous fistula, partially thrombosed.   OPERATIONS:  1. Insertion of a right forearm arteriovenous Gore-Tex graft, brachial      artery to basilic vein (4 mm - 7 mm stretch).  2. Ligation of left upper arm arteriovenous fistula with evacuation of      pseudoaneurysm, left upper arm fistula.   SURGEON:  Quita Skye. Hart Rochester, MD   FIRST ASSISTANT:  Nurse.   ANESTHESIA:  Local.   PROCEDURE IN DETAIL:  The patient was taken to the operating room and  placed in supine position at which time right upper extremity was  prepped with Betadine scrub and solution and draped in routine sterile  manner.  After infiltration of 1% Xylocaine with epinephrine, a  transverse incision was made in the antecubital area and antecubital  vein dissected free.  Cephalic branch was some explored and was not  adequate for AV fistula.  Basilic branch was 4.5 mm in size, dissected  free, artery exposed beneath the fascia, and it was an excellent vessel  with a good pulse.  A loop-shaped tunnel was then created after  infiltrating with 1% Xylocaine and a 4 x 7 mm stretch Gore-Tex graft  delivered through the tunnel using small counter-incision at the apex  the loop.  No heparin was given.  Artery was occluded proximally and  distally with vessel loops, opened with 15 blade, and extended with  Potts scissors.  A 4-mm end of the graft was spatulated and anastomosed  end-to-side with 6-0 Prolene.  Following this, 7-mm end of the graft was  anastomosed end-to-side to  basilic vein with 6-0 Prolene.  Clamps  released.  There was an excellent pulse and thrill in the graft.  No  protamine or heparin was given.  Wound was irrigated with saline, closed  in layers with Vicryl in subcuticular fashion.  Sterile dressing  applied.  Attention turned to the contralateral left arm which was then  prepped and draped in routine sterile manner.  There was an upper arm  fistula which was patent up to about 6 cm proximally where there was a  large thrombosed aneurysm involving the fistulous aneurysm, probably  measured of 3 x 5 cm.  After infiltration with Xylocaine, a short  transverse incision was made through the previous scar.  Cephalic vein  to brachial artery anastomosis dissected free proximally and distally  and the cephalic vein was ligated at the anastomosis with 2-0 silk,  being certain that there continued to be excellent Doppler flow distally  in the radial artery.  Following ligation of the fistula with two 2-0  silk ties, the vein was  opened more proximally and the old  organized thrombus within this proximal aneurysm was evacuated  completely and the vein ligated with 2-0 silk tie.  Adequate hemostasis  was achieved and wound closed in layers with Vicryl in subcuticular  fashion.  Sterile dressing applied.  The patient was taken to recovery  room in satisfactory condition.      Quita Skye Hart Rochester, M.D.  Electronically Signed     JDL/MEDQ  D:  08/28/2008  T:  08/28/2008  Job:  [id number]

## 2011-01-26 NOTE — Op Note (Signed)
NAMEEMMAROSE, KLINKE                ACCOUNT NO.:  [id number]   MEDICAL RECORD NO.:  [id number]          PATIENT TYPE:  AMB   LOCATION:  SDS                          FACILITY:  [facility]   PHYSICIAN:  Larina Earthly, M.D.    DATE OF BIRTH:  [birth year]   DATE OF PROCEDURE:  12/28/2008  DATE OF DISCHARGE:                               OPERATIVE REPORT   PREOPERATIVE DIAGNOSIS:  End-stage renal disease with occluded right  forearm loop arteriovenous Gore-Tex graft.   POSTOPERATIVE DIAGNOSIS:  End-stage renal disease with occluded right  forearm loop arteriovenous Gore-Tex graft.   PROCEDURE:  Thrombectomy, interposition graft revision to higher basilic  vein of right forearm loop arteriovenous Gore-Tex graft.   SURGEON:  Larina Earthly, MD   ASSISTANT:  Nurse.   ANESTHESIA:  MAC.   COMPLICATIONS:  None.   DISPOSITION:  To recovery room, stable.   PROCEDURE IN DETAIL:  The patient was taken to the operating room,  placed in supine position.  The area of the left arm was prepped and  draped in usual sterile fashion.  Incision was made over the antecubital  space, carried down to isolate the prior venous anastomosis, which was  to the basilic vein.  The graft was opened near the venous anastomosis  and there was a moderate stenosis at the venous anastomosis.  The vein  above this was of good caliber.  The graft was thrombectomized.  The  arterialized plug was removed.  An excellent inflow was encountered.  This was flushed with heparinized saline and reoccluded.  The vein was  exposed further proximal above the antecubital space.  The incision had  been in the site.  This was converted to an end-to-end anastomosis.  The  vein was spatulated further proximally and a new interposition graft of  7-mm Gore-Tex was brought into the field and was spatulated and sewn end-  to-end to the vein with a running 6-0 Prolene suture.  Clamps were  removed from the vein.  The graft was flushed with  heparinized saline  and reoccluded.  Next, the new interposition graft was cut to  appropriate length and was sewn end-to-end to the old graft with a  running 6-0 Prolene suture.  Clamps were removed and excellent thrill  was noted.  The wounds were irrigated with saline.  Hemostasis was  obtained with electrocautery.  Wounds were closed with 3-0 Vicryl in the  subcutaneous and subcuticular tissues.  Benzoin and Steri-Strips were  applied.     Larina Earthly, M.D.  Electronically Signed    TFE/MEDQ  D:  12/28/2008  T:  12/29/2008  Job:  [id number]

## 2011-01-26 NOTE — Procedures (Signed)
NAMESEIRRA, KOS                ACCOUNT NO.:  [id number]   MEDICAL RECORD NO.:  [id number]          PATIENT TYPE:  OUT   LOCATION:  RESP                          FACILITY:  [facility]   PHYSICIAN:  Edward L. Juanetta Gosling, M.D.DATE OF BIRTH:  1928/11/30   DATE OF PROCEDURE:  DATE OF DISCHARGE:  01/22/2009                            PULMONARY FUNCTION TEST   PATIENT OF:  Donna Bernard, MD   1. Spirometry shows a mild ventilatory defect with evidence of airflow      obstruction.  2. Lung volumes are normal.  3. DLCO is mildly reduced.  4. There is no significant bronchodilator effect.  5. This study is consistent with clinical diagnosis of COPD.      Edward L. Juanetta Gosling, M.D.  Electronically Signed     ELH/MEDQ  D:  01/24/2009  T:  01/24/2009  Job:  [id number]

## 2011-01-26 NOTE — Op Note (Signed)
NAMEAMILIANA, FOUTZ                ACCOUNT NO.:  [id number]   MEDICAL RECORD NO.:  [id number]          PATIENT TYPE:  OIB   LOCATION:  6742                         FACILITY:  [facility]   PHYSICIAN:  Anselm Pancoast. Weatherly, M.D.DATE OF BIRTH:  1929/08/01   DATE OF PROCEDURE:  05/07/2009  DATE OF DISCHARGE:                               OPERATIVE REPORT   PREOPERATIVE DIAGNOSIS:  Secondary hyperparathyroidism and chronic renal  failure.   OPERATION:  Parathyroidectomy x4 or possibly 5 glands and transplants in  the left forearm.   SURGEON:  Anselm Pancoast. Zachery Dakins, MD   HISTORY:  Julia Love is a 77 year old Caucasian female who is a  chronic renal failure patient.  She lives in [place] and has been on  hemodialysis for approximately 5 years.  Dr. Daleen Squibb is her cardiologist  and she has had 4 general anesthesia between the last 3 years related to  vascular access and she is on hypertensive medications.  She is followed  by Dr. Hyman Hopes, her nephrologist, dialyzed on Tuesday, Thursday, and  Saturday, and her parathyroid hormone level is approximately 1024 and  she was referred to me for a parathyroidectomy and transplant.  The  patient saw her cardiologist, I think Dr. Daleen Squibb has also seen her and  cleared her for surgery and she is here for the planned procedure.  She  had been dialyzed yesterday in [place] and the potassium today was  4.2.   DESCRIPTION OF PROCEDURE:  The patient was given 1 gram of Ancef and  taken to the operative suite, positioned on the OR table.  I had marked  the left arm that we were going to do the transplant and then we induced  general anesthesia, placed an endotracheal tube, and then put a roll up  under her shoulders with the neck extended.  The right arm where she has  got the functioning fistula now was protected and wrapped carefully and  I also put a little plastic splint to prevent any type of pressure on it  and then we prepped the neck with Betadine  scrub and solution and draped  in a sterile manner.  I marked the neck picking a lower transverse  crease for the incision and then elevated the superior and inferior  flaps probably about 6 cm or maybe 7 cm total incision and then elevated  the plastyma superiorly and inferiorly and then separated the strap  muscles.  A Mahorner retractor had been placed before dividing or  separating the strap muscles and I was working originally on the right  and Dr. Donell Beers was on the left.  The strap muscles were carefully  separated from the thyroid.  The thyroid itself looked normal.  She has  got a little increased vascularity as far as venous in the neck from  probably her vascular access and also I think she has had problems with  congestive heart failure.  We then found a very large parathyroid.  It  measured about 3.5 cm x 1.5 x 1 cm.  It looked like possibly could be  two together, but I sent  it down for a single sampling of the larger  nodule that was grossly the brownish obviously parathyroid tissue.  Next, we found a separate area to the left superiorly that measured  about 1 x 1 cm and it was not attached to the actual thyroid but on the  frozen exam they returned the largest was parathyroid and the second  lesion that was almost superior was thyroid tissue.  We had switched and  started working on the opposite side on receiving inflammation and on  the right side I could find a definite large parathyroid inferiorly that  measured probably about 2 cm x 1.5 x 1, very similar to what we had  found on the opposite side but not anywhere near as large and next on  the lateral what we thought was superior parathyroid was actually in the  carotid sheath and this was brownish in color, not anywhere near as  large but definitely a little brown nodule and the frozen on that says  this is thyroid tissue and this was definitely not contiguous with the  thyroid.  So now, we had not identified two on the  left and we went back  to the left side and then went on up higher and found a superior  parathyroid on the left that was about a centimeter in size, much  smaller than the two larger but definitely much larger than normal and  excised that and sent a good portion of it for pathology exam and they  said that it was also parathyroid.  I had also this large first nodule  sent, a piece of the second little nodule that really does looks like it  is definitely two separate glands and they both were parathyroid.  So,  then whether we have got two or possibly three on the left side I am not  sure.  We were dissecting on the right side trying to see if there was  anything inferiorly.  I could see the platysma, the esophageal groove,  the trachea, definitely identified the recurrent laryngeal nerve, and  there was a little nodule that was in the thyroid that was a little  firmer but not anything that looked worrisome but with this abnormal  thyroid tissue. I went and excised that and actually sent it for just  path exam and of course the pathologist confirms that is thyroid tissue  but is not thought to be anything thyroid malignancy.  Then I divided  the superior thyroidal artery, so I could definitely roll the superior  lobe on the right medially and then found a parathyroid that was well  behind this, much smaller than the 3-4 but larger than normal and sent  it and it was definitely confirmed the parathyroid.  So, now 4  parathyroids had been excised possibly.  We then carefully checked for  hemostasis, put 3 little pieces of Surgicel on the both right and left  sides, and then closed the strap muscles with interrupted sutures of 4-0  Vicryl, the platysma with 4-0 Monocryl, and then a few staples and Steri-  Strips on the skin.  Next, the left arm had been prepped  circumferentially and I made a little incision over the radial side and  then working on the flexures on the radial side, I took  probably 10 or  maybe 11 little pieces, less than a millimeter in size and transplanted  to the left forearm with little stab in the muscle and little piece  of  crushed parathyroid tissue and I had taken these about 3 of them from 3  different parathyroid glands.  The little stab wounds were closed with 5-  0 Prolene suture and then after the areas had been transplanted, closed  the deep fascia with 4-0 Vicryl and then closed the little probably 2-  1/2 inch incision with staples.  The incisions were wrapped with 4 x 4s  and Kerlix, the arm was just opened with 4 x 4s on the neck, and then  the patient extubated and sent to recovery room on stable postop  condition.  We will check the potassium in the recovery room.  Hopefully, she will not need be dialyzed until tomorrow and then we will  have her on the thyroid calcium replacements and whether she will go  home on Friday or possibly Saturday after her dialysis.  She lives in  [place] and will resume all of her usual medications.  She is not a  diabetic.      Anselm Pancoast. Zachery Dakins, M.D.  Electronically Signed     WJW/MEDQ  D:  05/07/2009  T:  05/08/2009  Job:  [id number]

## 2011-01-29 NOTE — Op Note (Signed)
NAME:  Julia Love, Julia Love                          ACCOUNT NO.:  [id number]   MEDICAL RECORD NO.:  [id number]                   PATIENT TYPE:  OIB   LOCATION:  2899                                 FACILITY:  [facility]   PHYSICIAN:  Larina Earthly, M.D.                 DATE OF BIRTH:  Jan 25, 1929   DATE OF PROCEDURE:  12/16/2003  DATE OF DISCHARGE:                                 OPERATIVE REPORT   PREOPERATIVE DIAGNOSIS:  Progressive renal insufficiency.   POSTOPERATIVE DIAGNOSIS:  Progressive renal insufficiency.   PROCEDURE:  Left upper arm arteriovenous fistula creation.   SURGEON:  Larina Earthly, M.D.   ASSISTANT:  Pecola Leisure, PA   ANESTHESIA:  MAC.   COMPLICATIONS:  None.   DISPOSITION:  To recovery room stable.   PROCEDURE IN DETAIL:  The patient was taken to the operating room and placed  in the supine position, where the area of the left arm was prepped and  draped in the usual sterile fashion.  Using local anesthesia, an incision  made over the cephalic vein at the level of the wrist.  The vein was very  small and sclerotic.  The vein was ligated distally and divided.  The vein  did not allow a 2 mm dilator and therefore was not acceptable for fistula  creation.  This was occluded.  The wound at the wrist was closed with 3-0  Vicryl in the subcutaneous and subcuticular tissue.  Benzoin and Steri-  Strips were applied.  Next a separate incision was made using local  anesthesia at the antecubital space.  The cephalic vein at the antecubital  space was of moderate size.  Tributary branches were ligated and divided.  The vein was mobilized to the level of below the antecubital space.  The  vein was ligated distally and divided.  The basilic vein was left intact.  The brachial artery was occluded proximally and distally and was opened with  an 11 blade, extended longitudinally with Potts scissors.  The cephalic vein  was brought into approximation of the brachial artery, was  spatulated, cut  to the appropriate length, and was sewn end-to-side to the artery with a  running 6-0 Prolene suture.  The clamps were removed and a good thrill was  noted.  The wounds were irrigated with saline, hemostasis with  electrocautery.  The wounds were closed with 3-0 Vicryl in the subcutaneous  and subcuticular tissue.  Benzoin and Steri-Strips were applied.                                               Larina Earthly, M.D.    TFE/MEDQ  D:  12/16/2003  T:  12/17/2003  Job:  [id number]

## 2011-01-29 NOTE — H&P (Signed)
NAMECABRINI, RUGGIERI                ACCOUNT NO.:  [id number]   MEDICAL RECORD NO.:  [id number]          PATIENT TYPE:  INP   LOCATION:  A217                          FACILITY:  [facility]   PHYSICIAN:  Donna Bernard, M.D.DATE OF BIRTH:  November 15, 1928   DATE OF ADMISSION:  08/31/2004  DATE OF DISCHARGE:  LH                                HISTORY & PHYSICAL   CHIEF COMPLAINT:  Severe chest pain.   HISTORY OF PRESENT ILLNESS:  This patient is a 77 year old female with  history of chronic renal failure on dialysis, type 2 diabetes,  hyperlipidemia, hypertension, gout, diabetic neuropathy, reflux, and other  assorted maladies, who presents to the office the day of admission with  acute complaints. The patient states she was in her usual state of fatigue  yesterday morning when she developed progressive cough throughout the day,  accompanied by a sharp pain on the right side of the chest. The patient did  not feel particularly short of breath however.  She did have some productive  cough of phlegm at times, there was a little bit of tinge which might have  been blood, the patient was not exactly sure. She noticed no fever. Last  week the patient had a bit of a viral illness with achiness and mild  congestion and cough. She has had no significant acute GI symptoms not GU  symptoms. No history of DVT or pulmonary emboli.   PRIOR SURGERIES:  Remote nephrectomy; hysterectomy; cholecystectomy;  tonsillectomy.   SOCIAL HISTORY:  The patient is widowed. Quit smoking in 1996. No alcohol  use. Has three children.   ALLERGIES:  None known.   FAMILY HISTORY:  Breast cancer, hypertension, hyperlipidemia.   REVIEW OF SYSTEMS:  Significant for diffuse fatigue since initiation  dialysis just a month ago along with occasional hot and cold spells.   PHYSICAL EXAMINATION:  VITAL SIGNS:  Afebrile, pulse rate 85, blood pressure  132/80, weight 161.  GENERAL:  The patient is holding her chest wall, appears  to be in some acute  distress.  LUNGS:  Somewhat diminished breath sounds at the bases. No crackles, no  wheezes, no obvious tachypnea.  HEART:  Regular rate and rhythm, no significant murmurs. No CVA tenderness.  ABDOMEN:  Soft, bowel sounds present. No upper quadrant tenderness.  EXTREMITIES:  Thin, no edema. Left arm fistula site appears intact,  impressive hematoma is noted in the upper arm.   SIGNIFICANT LABS:  Chest x-ray shows right pleural effusion, no obvious  infiltrate. Blood gas: P02 in the mid 70's, PCO2 40. White blood count  normal, hemoglobin down around 9, normal differential.  MET-7: Creatinine  and BUN up significantly as expected, no hypokalemia.  D-dimer elevated at 1.9 (often seen in folks with chronic renal failure).   IMPRESSION:  Relatively acute onset of severe right pleuritic pain  accompanied by effusion but otherwise normal chest x-ray in a chronic renal  failure patient with an elevated D-dimer. More than likely this represents  and infectious etiology but chronic renal failure could be a component as  could the unlikely but real  possibility of an embolus.   PLAN:  Intravenous antibiotics. Consult Dr. _______ to maintain dialysis.  Pain control with Morphine. Cover the patient with Lovenox. Will press on in  the morning with a spiral CT of the chest.  Further orders as noted on the  chart.     Kristine Royal   WSL/MEDQ  D:  08/31/2004  T:  09/01/2004  Job:  [id number]

## 2011-01-29 NOTE — Discharge Summary (Signed)
NAMEGARY, BULTMAN                ACCOUNT NO.:  [id number]   MEDICAL RECORD NO.:  [id number]          PATIENT TYPE:  INP   LOCATION:  A217                          FACILITY:  [facility]   PHYSICIAN:  Donna Bernard, M.D.DATE OF BIRTH:  May 11, 1929   DATE OF ADMISSION:  08/31/2004  DATE OF DISCHARGE:  12/21/2005LH                                 DISCHARGE SUMMARY   FINAL DIAGNOSES:  1.  Pneumonia.  2.  Pleuritic chest pain secondary to #1.  3.  End-stage renal disease.  4.  Type 2 diabetes.  5.  Hypertension.   FINAL DISPOSITION:  1.  The patient is discharged to home.  2.  The patient is to follow-up in dialysis tomorrow.  3.  The patient is to follow-up with her regular appointment.   DISCHARGE MEDICATIONS:  1.  Levaquin 250 mg daily.  2.  Maintain other medications at the same dose.   INITIAL HISTORY AND PHYSICAL:  Please see history and physical as dictated.   HOSPITAL COURSE:  This patient is a 77 year old female with a history of  chronic renal failure, type 2 diabetes, hyperlipidemia, hypertension, gout,  diabetic neuropathy, reflux and other assorted concerns.  She presented to  the office on the day of admission with acute complaints.  She noted  significant chest pain.  This was primarily right sided and quite sharp in  nature.  The patient had a slight cough.  She was in very significant  distress. She had noticed no fever.  We admitted her to the hospital and  started her on IV pain medication.  A chest x-ray revealed a right pleural  effusion with no obvious infiltrate.  Blood gas was within renal limits.  D-  dimer was slightly elevated, but felt to be likely due to chronic renal  failure.  We covered the patient with IV antibiotics.  Dr. Jorja Loa  was consulted to maintain dialysis.  Pain control was achieved with  morphine. We covered her with Lovenox.  A spiral CT scan was ordered to rule  out the potential for any type of pulmonary embolus.  CT scan was  obtained  and this showed a pneumonic infiltrate.  On the day of discharge the  patient's pain was under much better control.  She  was responding nicely to her antibiotics.  We had an extensive discussion  with the patient and the family both in the morning and on the evening of  discharge with at least 30 minutes spent covering all the various details of  her case.  The patient was discharged home with the diagnoses and  disposition as noted above.      WSL/MEDQ  D:  10/05/2004  T:  10/05/2004  Job:  [id number]

## 2011-01-29 NOTE — Consult Note (Signed)
Julia Love, KATH                ACCOUNT NO.:  [id number]   MEDICAL RECORD NO.:  [id number]          PATIENT TYPE:  INP   LOCATION:  A217                          FACILITY:  [facility]   PHYSICIAN:  Jorja Loa, M.D.DATE OF BIRTH:  1928/12/27   DATE OF CONSULTATION:  08/31/2004  DATE OF DISCHARGE:                                   CONSULTATION   REASON FOR CONSULTATION:  End-stage renal disease, for possible dialysis.   Ms. Julia Love is a patient who has a history of congenital kidney disease,  history of hypertension, gout, end-stage renal disease on maintenance  hemodialysis, presently seen by Dr. Gerda Diss for complaints of cough and also  right-sided abdominal pain.  At this moment the patient denies any nausea or  vomiting.  The other complaint she has is also some weakness.  According to  the patient, she was put on dialysis about a week ago.  Except for feeling  weak and dizzy, she does not have any other complaints.  Presently she  denies any shortness of breath, no dizziness, no lightheadedness, appetite  is reasonable.  Her last dialysis was on Saturday, and she is due for  dialysis tomorrow, September 01, 2004.   PAST MEDICAL HISTORY:  1.  As stated above.  2.  Patient with history of hypertension.  She was on Cardizem, and that was      discontinued because of low blood pressure.  3.  History of gout.  4.  History of anemia.  5.  History of congenital kidney disease, status post a nephrectomy in 1949,      and presently she has a single kidney with multiple cysts.  6.  She has also a history of hypercholesterolemia.  7.  History of GERD.   Her medications as an outpatient consist of:  1.  Epogen 5000 units p.o. q.d.  2.  Aciphex 20 mg p.o. daily.  3.  Zocor 40 mg p.o. daily.  4.  Also she is getting calcitriol 0.25 mcg IV after each dialysis.   SOCIAL HISTORY:  No history of smoking.  No history of alcohol abuse.  The  patient has three healthy kids.   REVIEW OF  SYSTEMS:  No rales, no history of fever or chills or sweating.  Complains of some weakness.  Cough, dry, non-sputum.  No sputum production.  Right-sided pleuritic type of chest pain, mainly when she is coughing.  She  does not have any nausea or vomiting, no urgency or frequency.   PHYSICAL EXAMINATION:  GENERAL:  The patient is alert, in no apparent  distress.  VITAL SIGNS:  She is afebrile.  Her pulse rate is 100, blood pressure  115/71.  HEENT:  No conjunctival pallor.  Nonicteric.  Oral mucosa somewhat dry.  NECK:  Supple, no JVD.  CHEST:  Clear to auscultation.  CARDIAC:  Regular rate and rhythm, no murmurs.  ABDOMEN:  Soft, positive bowel sounds.  EXTREMITIES:  No edema.   Her ABG, pH of 7.43, PCO2 of 40, PO2 of 70.7, O2 saturation is 93.5 on room  air.  Her white blood cell  count is 8.6, hemoglobin 9.4, hematocrit 27.8.  According to the dialysis nurse, her hemoglobin was around 11 _______ to  have come down, platelets of 280.  D-dimer is 1.97.  Sodium 135, potassium  4, BUN is 65, creatinine 8.6.  Albumin 2.5 and total protein is 5.6.   ASSESSMENT:  1.  End-stage, status post hemodialysis on Saturday.  Presently her      potassium is within acceptable range.  No sign of fluid overload.  O2      saturation seems to be reasonable.  She is due for dialysis tomorrow.  2.  Anemia.  At this moment, seems to be secondary to her renal      insufficiency; however, since her hemoglobin and hematocrit seem to be      declining even though she is on Epogen, need to rule out iron-deficiency      anemia.  3.  Hypertension.  The patient was previously on Cardizem.  Blood pressure      seems to be reasonably controlled, and she is off medications.  4.  History of gout.  She is on allopurinol according to her, but the      medication is not documented in her dialysis unit orders.  5.  History of multiple cysts in the kidney.  6.  History of a single kidney, status post resection of one of  her kidneys.  7.  Hypercholesterolemia.   RECOMMENDATIONS:  1.  We will do stools for guaiac.  2.  We will follow her H&H.  3.  We will put her on Epogen 5000 units after each dialysis.  4.  We will continue with other outpatient medications and will follow the      patient with you.     Bely   BB/MEDQ  D:  08/31/2004  T:  09/01/2004  Job:  [id number]

## 2011-01-29 NOTE — Procedures (Signed)
NAMETALAJAH, SLIMP                ACCOUNT NO.:  [id number]   MEDICAL RECORD NO.:  [id number]          PATIENT TYPE:  INP   LOCATION:  A217                          FACILITY:  [facility]   PHYSICIAN:  Donna Bernard, M.D.DATE OF BIRTH:  04-14-1929   DATE OF PROCEDURE:  08/31/2004  DATE OF DISCHARGE:  09/02/2004                                EKG INTERPRETATION   EKG reveals normal sinus rhythm with no significant ST-T changes. Early  repolarization is noted in V4 through V6.     W. S   WSL/MEDQ  D:  09/08/2004  T:  09/08/2004  Job:  [id number]

## 2011-03-29 ENCOUNTER — Other Ambulatory Visit (HOSPITAL_COMMUNITY): Payer: Self-pay | Admitting: Interventional Radiology

## 2011-03-29 DIAGNOSIS — N186 End stage renal disease: Secondary | ICD-10-CM

## 2011-04-07 ENCOUNTER — Ambulatory Visit (HOSPITAL_COMMUNITY)
Admission: RE | Admit: 2011-04-07 | Discharge: 2011-04-07 | Disposition: A | Discharge status: Home / Self Care | Payer: Medicare Other | Source: Ambulatory Visit | Attending: Nephrology | Admitting: Nephrology

## 2011-04-07 DIAGNOSIS — R209 Unspecified disturbances of skin sensation: Secondary | ICD-10-CM | POA: Insufficient documentation

## 2011-04-07 DIAGNOSIS — R42 Dizziness and giddiness: Secondary | ICD-10-CM | POA: Insufficient documentation

## 2011-04-12 ENCOUNTER — Other Ambulatory Visit (HOSPITAL_COMMUNITY): Payer: Self-pay | Admitting: Nephrology

## 2011-04-12 ENCOUNTER — Other Ambulatory Visit (HOSPITAL_COMMUNITY): Payer: Self-pay | Admitting: Interventional Radiology

## 2011-04-12 ENCOUNTER — Ambulatory Visit (HOSPITAL_COMMUNITY)
Admission: RE | Admit: 2011-04-12 | Discharge: 2011-04-12 | Disposition: A | Discharge status: Home / Self Care | Payer: Medicare Other | Source: Ambulatory Visit | Attending: Nephrology | Admitting: Nephrology

## 2011-04-12 DIAGNOSIS — N186 End stage renal disease: Secondary | ICD-10-CM

## 2011-04-12 DIAGNOSIS — Y832 Surgical operation with anastomosis, bypass or graft as the cause of abnormal reaction of the patient, or of later complication, without mention of misadventure at the time of the procedure: Secondary | ICD-10-CM | POA: Insufficient documentation

## 2011-04-12 DIAGNOSIS — T82898A Other specified complication of vascular prosthetic devices, implants and grafts, initial encounter: Secondary | ICD-10-CM | POA: Insufficient documentation

## 2011-04-12 MED ORDER — IOHEXOL 300 MG/ML  SOLN: 100.0000 mL | Freq: Once | INTRAMUSCULAR | Status: AC | PRN

## 2011-04-12 MED ORDER — IOHEXOL 300 MG/ML  SOLN
100.0000 mL | Freq: Once | INTRAMUSCULAR | Status: AC | PRN
  Administered 2011-04-12: 55 mL via INTRAVENOUS

## 2011-05-26 ENCOUNTER — Ambulatory Visit (INDEPENDENT_AMBULATORY_CARE_PROVIDER_SITE_OTHER): Payer: Medicare Other | Admitting: Internal Medicine

## 2011-05-26 ENCOUNTER — Encounter (INDEPENDENT_AMBULATORY_CARE_PROVIDER_SITE_OTHER): Payer: Self-pay | Admitting: Internal Medicine

## 2011-05-26 DIAGNOSIS — R1084 Generalized abdominal pain: Secondary | ICD-10-CM

## 2011-05-26 MED ORDER — DICYCLOMINE HCL 10 MG PO CAPS: 10.0000 mg | ORAL_CAPSULE | Freq: Every day | ORAL | Status: DC

## 2011-05-26 NOTE — Progress Notes (Signed)
Subjective:     Patient ID: Julia Love, female   DOB: 1929-04-19, 77 y.o.   MRN: [id number]  HPI  Presents today with c/o that certain upset her stomach and she will have diarrhea. Sunday night she ate a small piece of chocolate cake.  She also ate 3 pieces of watermelon. That night she had diarrhea which was water.  Sunday night she had about 10 loose watery stools.  The diarrhea stopped around 5am Monday morning.  She says she is better. She is trying to watch what she eats. If she eats slaw or greasy foods she will have diarrhea.   She tells me she has a hx of IBS.  Her last office visit was in December of 2011 for diarrhea of 2 months duration. She was started on Imodium and her stool studies were negative. In the past she has been on Dicyclomine 10mg once a day in the am for post-prandial diarrhea. She has been on dialysis x 7 yrs. Hx of polycystic kidney disease.   Review of Systems  See hpi     Current Outpatient Prescriptions  Medication Sig Dispense Refill  . allopurinol (ZYLOPRIM) 100 MG tablet Take 100 mg by mouth daily.        . amLODipine (NORVASC) 5 MG tablet Take 2.5 mg by mouth daily.        . aspirin 81 MG tablet Take 81 mg by mouth daily.        . calcium carbonate (TUMS - DOSED IN MG ELEMENTAL CALCIUM) 500 MG chewable tablet Chew 1 tablet by mouth as needed.        . folic acid-vitamin b complex-vitamin c-selenium-zinc (DIALYVITE) 3 MG TABS Take 1 tablet by mouth daily.        . lisinopril (PRINIVIL,ZESTRIL) 40 MG tablet Take 40 mg by mouth daily.        . omeprazole (PRILOSEC) 20 MG capsule Take 20 mg by mouth daily.        . dicyclomine (BENTYL) 10 MG capsule Take 1 capsule (10 mg total) by mouth daily.  30 capsule  2   .No Known Allergies Past Medical History  Diagnosis Date  . S/P tonsillectomy     1945  . History of nephrectomy     (right) for a non-functioning kidney in 1949  . S/P complete hysterectomy     19 90  . Hypertension     for several years  .  Fistula     rt arm for dialysis which is not in use at present  . Hemodialysis patient     since November 2005  . Gastric ulcer     back in 1989  . Parathyroid disorder     surgery in August 2011  . Rectocele     repair several years ago  . GERD (gastroesophageal reflux disease)   . S/P laparoscopic cholecystectomy      Objective:   Physical ExamAlert and oriented. Skin warm and dry. Oral mucosa is moist. n. Sclera anicteric, conjunctivae is pink. Thyroid not enlarged. No cervical lymphadenopathy. Lungs clear. Loud  murmur heard.Marland Kitchen Heart regular,  rate and rhythm.  Abdomen is soft. Bowel sounds are positive. No hepatomegaly. No abdominal masses felt. No tenderness.  No edema to lower extremities. Patient is alert and oriented.      Assessment:    Diarrhea which has resolved and related to certain foods.  Suspect she may have a component of irritable bowel.     Plan:  Will try her on Dicyclomine 10mg  once a day.  This is a very low dose. I advised her if she became constipated to stop the Dicyclomine.       She will call with a progress report in 2 weeks. She should also try Kegel exercises.

## 2011-06-10 LAB — POCT I-STAT 4, (NA,K, GLUC, HGB,HCT)
Glucose, Bld: 80
HCT: 45
Hemoglobin: 15.3 — ABNORMAL HIGH
Potassium: 5.3 — ABNORMAL HIGH
Sodium: 138

## 2011-06-15 LAB — GLUCOSE, CAPILLARY: Glucose-Capillary: 79

## 2011-06-17 LAB — POCT I-STAT 4, (NA,K, GLUC, HGB,HCT)
Glucose, Bld: 81 mg/dL (ref 70–99)
HCT: 33 % — ABNORMAL LOW (ref 36.0–46.0)
Hemoglobin: 11.2 g/dL — ABNORMAL LOW (ref 12.0–15.0)
Potassium: 5.1 mEq/L (ref 3.5–5.1)
Sodium: 134 mEq/L — ABNORMAL LOW (ref 135–145)

## 2011-06-17 LAB — GLUCOSE, CAPILLARY

## 2011-06-30 LAB — BASIC METABOLIC PANEL
BUN: 29 — ABNORMAL HIGH
CO2: 26
Calcium: 11.1 — ABNORMAL HIGH
Creatinine, Ser: 7.29 — ABNORMAL HIGH
GFR calc non Af Amer: 5 — ABNORMAL LOW
Glucose, Bld: 102 — ABNORMAL HIGH

## 2011-06-30 LAB — URINALYSIS, ROUTINE W REFLEX MICROSCOPIC
Glucose, UA: NEGATIVE
Ketones, ur: NEGATIVE
Leukocytes, UA: NEGATIVE
Protein, ur: 100 — AB
pH: 7.5

## 2011-06-30 LAB — URINE MICROSCOPIC-ADD ON

## 2011-07-01 ENCOUNTER — Ambulatory Visit (INDEPENDENT_AMBULATORY_CARE_PROVIDER_SITE_OTHER): Payer: Medicare Other | Admitting: Otolaryngology

## 2011-07-01 DIAGNOSIS — R42 Dizziness and giddiness: Secondary | ICD-10-CM

## 2011-07-01 DIAGNOSIS — H8110 Benign paroxysmal vertigo, unspecified ear: Secondary | ICD-10-CM

## 2011-07-12 ENCOUNTER — Ambulatory Visit (INDEPENDENT_AMBULATORY_CARE_PROVIDER_SITE_OTHER): Payer: Medicare Other | Admitting: Internal Medicine

## 2011-07-12 ENCOUNTER — Encounter (INDEPENDENT_AMBULATORY_CARE_PROVIDER_SITE_OTHER): Payer: Self-pay | Admitting: Internal Medicine

## 2011-07-12 VITALS — BP 118/76 | HR 74 | Temp 98.2°F | Resp 12 | Ht 64.0 in | Wt 173.0 lb

## 2011-07-12 DIAGNOSIS — R197 Diarrhea, unspecified: Secondary | ICD-10-CM

## 2011-07-12 NOTE — Progress Notes (Signed)
Presenting complaint; followup for diarrhea. Subjective; patient is 77 year old Caucasian female who was last seen on 05/26/2011 for few week history of nonbloody diarrhea. She had similar episode one year ago and her stool studies were negative through this office. This time she had stool studies via dialysis center and they were again negative. She was begun on low-dose dicyclomine and Imodium. Today she is complaining less about her diarrhea but more about her dizziness. She was seen by ENT specialist and was told that nothing could be done. When she walks she is afraid she will fall either to the right left on her front. She is not having 2 formed stools daily. She denies melena or rectal bleeding. She states her heartburn is well controlled with therapy. At times she has empty feeling in her epigastric region this goes away spontaneously. She states her heartburn is well controlled with therapy. Current medications Current Outpatient Prescriptions  Medication Sig Dispense Refill  . acetaminophen (PAIN RELIEF) 500 MG tablet Take 500 mg by mouth as needed.        Marland Kitchen allopurinol (ZYLOPRIM) 100 MG tablet Take 100 mg by mouth Nightly.       Marland Kitchen aspirin 81 MG tablet Take 81 mg by mouth daily.        . calcium carbonate (TUMS - DOSED IN MG ELEMENTAL CALCIUM) 500 MG chewable tablet Chew by mouth as needed. Patient takes 2 Tums with each meal      . lisinopril (PRINIVIL,ZESTRIL) 40 MG tablet Take 20 mg by mouth. Take 2 Tablets at Bedtime      . loperamide (IMODIUM) 2 MG capsule Take 2 mg by mouth daily. Gluten Free       . meclizine (ANTIVERT) 25 MG tablet Take 25 mg by mouth 2 (two) times daily.        . Multiple Vitamins-Minerals (DIALYVITE 800/ULTRA D PO) Take by mouth. Take 1 Tablet Daily       . omeprazole (PRILOSEC) 20 MG capsule Take 20 mg by mouth daily.         dicyclomine 10 mg every morning by mouth. Objective BP 118/76  Pulse 74  Temp(Src) 98.2 F (36.8 C) (Oral)  Resp 12  Ht 5\' 4"  (1.626  m)  Wt 78.472 kg (173 lb)  BMI 29.70 kg/m2 Conjunctiva is pink. Sclerae nonicteric. Oropharyngeal mucosa is normal. No neck masses or thyromegaly noted. Abdomen is symmetrical bowel sounds are normal. No bruits noted. On palpation abdomen is soft. No organomegaly or masses. There is a small firm area about 1 cm in diameter in right lower quadrant possibly a small subcutaneous lipoma. No LE edema or clubbing noted. Assessment #1. Diarrhea of acute onset. Rule studies are negative and she has responded to symptomatic therapy. She possibly had an infection. If she keeps having these spells would need further evaluation. #2. Dizziness. This does not appear to be postural symptom or lightheadedness. I would like her to follow with Dr. Lubertha South to determine the next step. Plan Discontinue dicyclomine. Benefiber fiber 4 g by mouth daily. Can stop Imodium after 4 weeks If diarrhea or relapses she will give [place] a call

## 2011-07-12 NOTE — Patient Instructions (Addendum)
Benefiber 4 g by mouth daily. Can go back on dicyclomine if diarrhea recurs(same dose as before which is 10 mg daily) Continue Imodium 2 mg daily for another 4 weeks.

## 2011-07-27 ENCOUNTER — Encounter: Payer: Self-pay | Admitting: Cardiology

## 2011-07-27 ENCOUNTER — Ambulatory Visit (INDEPENDENT_AMBULATORY_CARE_PROVIDER_SITE_OTHER): Payer: Medicare Other | Admitting: Cardiology

## 2011-07-27 VITALS — BP 144/67 | HR 81 | Resp 16 | Ht 62.0 in | Wt 158.0 lb

## 2011-07-27 DIAGNOSIS — R42 Dizziness and giddiness: Secondary | ICD-10-CM

## 2011-07-27 NOTE — Patient Instructions (Signed)
Your physician recommends that you continue on your current medications as directed. Please refer to the Current Medication list given to you today.  Your physician recommends that you schedule a follow-up appointment in: 1 year  

## 2011-07-27 NOTE — Progress Notes (Signed)
HPI Julia Love comes in today for evaluation of episodic dizziness. She's had this evaluated in the past and most notably had a carotid ultrasound within the past year which showed no obstructive disease. Some of this was originally positional vertigo by her description. She was evaluated by ENT.  She still on chronic dialysis and has been for about 8 years. He is absolutely exhausted after dialysis in case he gets dizzy when she stands up. She is followed once but this was while making the bed when she  tripped on the covers.  She denies any chest pain, orthopnea, PND, edema, palpitations or syncope.  Past Medical History  Diagnosis Date  . S/P tonsillectomy     1945  . History of nephrectomy     (right) for a non-functioning kidney in 1949  . S/P complete hysterectomy     1990  . Hypertension     for several years  . Fistula     rt arm for dialysis which is not in use at present  . Hemodialysis patient     since November 2005  . Gastric ulcer     back in 1989  . Parathyroid disorder     surgery in August 2011  . Rectocele     repair several years ago  . GERD (gastroesophageal reflux disease)   . S/P laparoscopic cholecystectomy     Current Outpatient Prescriptions  Medication Sig Dispense Refill  . acetaminophen (PAIN RELIEF) 500 MG tablet Take 500 mg by mouth as needed.        Marland Kitchen allopurinol (ZYLOPRIM) 100 MG tablet Take 100 mg by mouth Nightly.       Marland Kitchen amLODipine (NORVASC) 5 MG tablet Take 5 mg by mouth as directed. 1/2 po qam and 1 qhs       . aspirin 81 MG tablet Take 81 mg by mouth daily.        . calcium carbonate (TUMS - DOSED IN MG ELEMENTAL CALCIUM) 500 MG chewable tablet Chew by mouth as needed. Patient takes 2 Tums with each meal      . lisinopril (PRINIVIL,ZESTRIL) 40 MG tablet Take 20 mg by mouth. Take 2 Tablets at Bedtime      . Multiple Vitamins-Minerals (DIALYVITE 800/ULTRA D PO) Take by mouth. Take 1 Tablet Daily       . omeprazole (PRILOSEC) 20 MG capsule  Take 20 mg by mouth daily.          No Known Allergies  Family History  Problem Relation Age of Onset  . Hypertension Mother   . Stroke Mother   . Hypertension Father   . Stroke Father   . Hypertension Daughter   . Hypertension Daughter   . Healthy Son     History   Social History  . Marital Status: Widowed    Spouse Name: N/A    Number of Children: N/A  . Years of Education: N/A   Occupational History  . Not on file.   Social History Main Topics  . Smoking status: Former Smoker    Types: Cigarettes    Quit date: 07/11/1986  . Smokeless tobacco: Never Used   Comment: Patient smoked 1 pack a day  . Alcohol Use: No  . Drug Use: No  . Sexually Active: Not on file   Other Topics Concern  . Not on file   Social History Narrative  . No narrative on file    ROS ALL NEGATIVE EXCEPT THOSE NOTED IN HPI  PE  General Appearance: well developed, well nourished in no acute distress, chronically ill HEENT: symmetrical face, PERRLA, good dentition  Neck: no JVD, thyromegaly, or adenopathy, trachea midline Chest: symmetric without deformity Cardiac: PMI non-displaced, RRR, normal S1, S2, no gallop, soft systolic murmur Lung: clear to ausculation and percussion Vascular: all pulses full , bilateral referred sounds from the heart into the base of the neck. Abdominal: nondistended, nontender, good bowel sounds, no HSM, no bruits Extremities: no cyanosis, clubbing or edema, no sign of DVT, no varicosities  Skin: normal color, no rashes Neuro: alert and oriented x 3, non-focal Pysch: normal affect  EKG Normal sinus rhythm, normal EKG BMET    Component Value Date/Time   NA 132* 05/10/2009 0330   K 4.5 05/10/2009 0330   CL 95* 05/10/2009 0330   CO2 25 05/10/2009 0330   GLUCOSE 98 05/10/2009 0330   BUN 41* 05/10/2009 0330   CREATININE 8.14* 05/10/2009 0330   CALCIUM 7.0* 05/10/2009 0330   GFRNONAA 5* 05/10/2009 0330   GFRAA  Value: 6        The eGFR has been calculated using  the MDRD equation. This calculation has not been validated in all clinical situations. eGFR's persistently <60 mL/min signify possible Chronic Kidney Disease.* 05/10/2009 0330    Lipid Panel  No results found for this basename: chol, trig, hdl, cholhdl, vldl, ldlcalc    CBC    Component Value Date/Time   WBC 7.6 05/09/2009 0744   RBC 2.56* 05/09/2009 0744   HGB 8.4* 05/09/2009 0744   HCT 24.5* 05/09/2009 0744   PLT 116* 05/09/2009 0744   MCV 96.0 05/09/2009 0744   MCHC 34.0 05/09/2009 0744   RDW 15.7* 05/09/2009 0744   LYMPHSABS 1.8 05/02/2009 0929   MONOABS 0.6 05/02/2009 0929   EOSABS 0.2 05/02/2009 0929   BASOSABS 0.0 05/02/2009 0929

## 2011-07-27 NOTE — Assessment & Plan Note (Signed)
I do not feel this is cardiac. I suspect is multifactorial from her comorbidities including chronic dialysis in significant fluid shifts. Her biggest risk is falling which I reviewed with her and her daughter today at length. Orthostatic precautions and change position precautions reinforced. She's been advised to lie down if she gets dizzy with standing to avoid injury. I'll see her back in a year or p.r.n. I've also taken the time to advise her daughter about end-of-life issues. She does not want To be placed on a respirator. DNR request given to her and family.

## 2011-07-29 ENCOUNTER — Ambulatory Visit (INDEPENDENT_AMBULATORY_CARE_PROVIDER_SITE_OTHER): Payer: Medicare Other | Admitting: Otolaryngology

## 2011-07-29 DIAGNOSIS — H8110 Benign paroxysmal vertigo, unspecified ear: Secondary | ICD-10-CM

## 2011-09-27 ENCOUNTER — Other Ambulatory Visit (INDEPENDENT_AMBULATORY_CARE_PROVIDER_SITE_OTHER): Payer: Self-pay | Admitting: Internal Medicine

## 2011-10-06 ENCOUNTER — Telehealth (INDEPENDENT_AMBULATORY_CARE_PROVIDER_SITE_OTHER): Payer: Self-pay | Admitting: *Deleted

## 2011-10-06 ENCOUNTER — Other Ambulatory Visit (INDEPENDENT_AMBULATORY_CARE_PROVIDER_SITE_OTHER): Payer: Self-pay | Admitting: *Deleted

## 2011-10-06 ENCOUNTER — Encounter (INDEPENDENT_AMBULATORY_CARE_PROVIDER_SITE_OTHER): Payer: Self-pay | Admitting: Internal Medicine

## 2011-10-06 ENCOUNTER — Ambulatory Visit (INDEPENDENT_AMBULATORY_CARE_PROVIDER_SITE_OTHER): Payer: Medicare Other | Admitting: Internal Medicine

## 2011-10-06 DIAGNOSIS — K625 Hemorrhage of anus and rectum: Secondary | ICD-10-CM

## 2011-10-06 DIAGNOSIS — D649 Anemia, unspecified: Secondary | ICD-10-CM | POA: Insufficient documentation

## 2011-10-06 DIAGNOSIS — R195 Other fecal abnormalities: Secondary | ICD-10-CM

## 2011-10-06 NOTE — Telephone Encounter (Signed)
Patient needs movi prep 

## 2011-10-06 NOTE — Patient Instructions (Signed)
Colonoscopy with Dr. Karilyn Cota. If colonoscopy normal, he will proceed with an EGD

## 2011-10-06 NOTE — Progress Notes (Signed)
Subjective:     Patient ID: Julia Love, female   DOB: June 10, 1929, 78 y.o.   MRN: [id number]  HPI  Julia Love is a 78 yr old female here today.  She tells me that when she was at dialysis 2 weeks ago her hemoglobin was low and she had a positive stool card.  She tells me her hemoglobin was 9.6. (Will get those records from dialysis). She denies seeing blood in her stool. She says she had a black stool last week. She has a hx of a gastric ulcer. Her appetite is okay. Weight is about the same. She occasionally has lower abdominal  pain but not ever day. She denies acid reflux.  Occasionally she will have food come up in her esophagus.  She usually has a BM daily but does alternate between constipation and diarrhea. Dialysis Tues, Thurs and Saturday EGD 2009: No evidence of PUD. Edematous, erythematous antral prepyloric folds with few erosions. Segment was biopsied. Bulbar erosions.   Her last colonoscopy was in 2007 by Dr. Karilyn Cota and it was normal. One polyp. I will try to locate that  Report.  09/24/2010 H and H 9.6 and 28.8 09/17/2010  H and H 9.4 and 28.2 09/11/2011 H and H 10.0 and 30.0 09/04/2011 H and H 10.8 and 32.4  Review of Systems see hpi     Current Outpatient Prescriptions  Medication Sig Dispense Refill  . acetaminophen (PAIN RELIEF) 500 MG tablet Take 500 mg by mouth as needed.        Marland Kitchen allopurinol (ZYLOPRIM) 100 MG tablet Take 100 mg by mouth Nightly.       Marland Kitchen amLODipine (NORVASC) 5 MG tablet Take 5 mg by mouth as directed. 1/2 po qam and 1 qhs       . aspirin 81 MG tablet Take 81 mg by mouth daily.        . calcium carbonate (TUMS - DOSED IN MG ELEMENTAL CALCIUM) 500 MG chewable tablet Chew by mouth as needed. Patient takes 2 Tums with each meal      . lisinopril (PRINIVIL,ZESTRIL) 40 MG tablet Take 20 mg by mouth. Take 2 Tablets at Bedtime      . Multiple Vitamins-Minerals (DIALYVITE 800/ULTRA D PO) Take by mouth. Take 1 Tablet Daily       . omeprazole (PRILOSEC) 20 MG capsule Take  20 mg by mouth daily.         Past Medical History  Diagnosis Date  . S/P tonsillectomy     1945  . History of nephrectomy     (right) for a non-functioning kidney in 1949  . S/P complete hysterectomy     1990  . Hypertension     for several years  . Fistula     rt arm for dialysis which is not in use at present  . Hemodialysis patient     since November 2005  . Gastric ulcer     back in 1989  . Parathyroid disorder     surgery in August 2011  . Rectocele     repair several years ago  . GERD (gastroesophageal reflux disease)   . S/P laparoscopic cholecystectomy    Past Surgical History  Procedure Date  . Abdominal hysterectomy 1990   History   Social History  . Marital Status: Widowed    Spouse Name: N/A    Number of Children: N/A  . Years of Education: N/A   Occupational History  . Not on file.   Social  History Main Topics  . Smoking status: Former Smoker    Types: Cigarettes    Quit date: 07/11/1986  . Smokeless tobacco: Never Used   Comment: Patient smoked 1 pack a day  . Alcohol Use: No  . Drug Use: No  . Sexually Active: Not on file   Other Topics Concern  . Not on file   Social History Narrative  . No narrative on file   Family Status  Relation Status Death Age  . Mother Deceased 41  . Father Deceased 51  . Sister Deceased     Still born  . Sister Deceased at birth  . Daughter Alive   . Daughter Alive   . Son Alive    No Known Allergies  Objective:   Physical Exam  Filed Vitals:   10/06/11 1052  Height: 5\' 4"  (1.626 m)  Weight: 157 lb 4.8 oz (71.351 kg)    Alert and oriented. Skin warm and dry. Oral mucosa is moist.   . Sclera anicteric, conjunctivae is pink. Thyroid not enlarged. No cervical lymphadenopathy. Lungs clear. Heart regular rate and rhythm.  Abdomen is soft. Bowel sounds are positive. No hepatomegaly. No abdominal masses felt. No tenderness.  No edema to lower extremities. Patient is alert and oriented.      Assessment:     Anemia, Guaiac positive stool, grossly positive. Colonic neoplasm needs to be ruled out. Polyp, AVM is also in the differential.     Plan:       Colonoscopy with Dr. Karilyn Cota. Procedures reviewed with patient. If colonoscopy normal, Dr. Karilyn Cota will proceed with an EGD.

## 2011-10-08 MED ORDER — PEG-KCL-NACL-NASULF-NA ASC-C 100 G PO SOLR
1.0000 | Freq: Once | ORAL | Status: DC
Start: 2011-10-06 — End: 2011-10-20

## 2011-10-11 ENCOUNTER — Encounter (HOSPITAL_COMMUNITY): Payer: Self-pay | Admitting: Pharmacy Technician

## 2011-10-19 MED ORDER — SODIUM CHLORIDE 0.45 % IV SOLN: Freq: Once | INTRAVENOUS | Status: DC

## 2011-10-20 ENCOUNTER — Encounter (HOSPITAL_COMMUNITY)
Admission: RE | Disposition: A | Discharge status: Home / Self Care | Payer: Self-pay | Source: Ambulatory Visit | Attending: Internal Medicine

## 2011-10-20 ENCOUNTER — Ambulatory Visit (HOSPITAL_COMMUNITY)
Admission: RE | Admit: 2011-10-20 | Discharge: 2011-10-20 | Disposition: A | Discharge status: Home / Self Care | Payer: Medicare Other | Source: Ambulatory Visit | Attending: Internal Medicine | Admitting: Internal Medicine

## 2011-10-20 ENCOUNTER — Other Ambulatory Visit (INDEPENDENT_AMBULATORY_CARE_PROVIDER_SITE_OTHER): Payer: Self-pay | Admitting: Internal Medicine

## 2011-10-20 ENCOUNTER — Encounter (HOSPITAL_COMMUNITY): Payer: Self-pay | Admitting: *Deleted

## 2011-10-20 DIAGNOSIS — K921 Melena: Secondary | ICD-10-CM

## 2011-10-20 DIAGNOSIS — D128 Benign neoplasm of rectum: Secondary | ICD-10-CM | POA: Insufficient documentation

## 2011-10-20 DIAGNOSIS — D126 Benign neoplasm of colon, unspecified: Secondary | ICD-10-CM

## 2011-10-20 DIAGNOSIS — K5730 Diverticulosis of large intestine without perforation or abscess without bleeding: Secondary | ICD-10-CM

## 2011-10-20 DIAGNOSIS — Z992 Dependence on renal dialysis: Secondary | ICD-10-CM | POA: Insufficient documentation

## 2011-10-20 DIAGNOSIS — I10 Essential (primary) hypertension: Secondary | ICD-10-CM | POA: Insufficient documentation

## 2011-10-20 DIAGNOSIS — Z79899 Other long term (current) drug therapy: Secondary | ICD-10-CM | POA: Insufficient documentation

## 2011-10-20 DIAGNOSIS — D129 Benign neoplasm of anus and anal canal: Secondary | ICD-10-CM

## 2011-10-20 DIAGNOSIS — D649 Anemia, unspecified: Secondary | ICD-10-CM

## 2011-10-20 DIAGNOSIS — R195 Other fecal abnormalities: Secondary | ICD-10-CM

## 2011-10-20 DIAGNOSIS — C184 Malignant neoplasm of transverse colon: Secondary | ICD-10-CM | POA: Insufficient documentation

## 2011-10-20 HISTORY — PX: COLONOSCOPY: SHX5424

## 2011-10-20 HISTORY — PX: ESOPHAGOGASTRODUODENOSCOPY: SHX5428

## 2011-10-20 SURGERY — COLONOSCOPY
Anesthesia: Moderate Sedation

## 2011-10-20 MED ORDER — MEPERIDINE HCL 25 MG/ML IJ SOLN
INTRAMUSCULAR | Status: DC | PRN
  Administered 2011-10-20 (×2): 25 mg via INTRAVENOUS

## 2011-10-20 MED ORDER — MEPERIDINE HCL 50 MG/ML IJ SOLN
INTRAMUSCULAR | Status: AC
  Filled 2011-10-20: qty 1

## 2011-10-20 MED ORDER — MIDAZOLAM HCL 5 MG/5ML IJ SOLN
INTRAMUSCULAR | Status: AC
  Filled 2011-10-20: qty 10

## 2011-10-20 MED ORDER — STERILE WATER FOR IRRIGATION IR SOLN
Status: DC | PRN
  Administered 2011-10-20: 14:00:00

## 2011-10-20 MED ORDER — MIDAZOLAM HCL 5 MG/5ML IJ SOLN
INTRAMUSCULAR | Status: DC | PRN
  Administered 2011-10-20 (×3): 1 mg via INTRAVENOUS
  Administered 2011-10-20: 2 mg via INTRAVENOUS

## 2011-10-20 MED ORDER — SODIUM CHLORIDE 0.9 % IV SOLN
INTRAVENOUS | Status: DC
  Administered 2011-10-20: 12:00:00 via INTRAVENOUS

## 2011-10-20 NOTE — H&P (Signed)
Julia Love is an 78 y.o. female.   Chief Complaint: Asian is here for colonoscopy and if it is negative followed by EGD. HPI: Patient is an 78 year old Caucasian female with end-stage renal disease who is on hemodialysis was noted to have some drop in her H&H. While there is no history of melena or rectal bleeding she was noted to have heme-positive stools. She denies nausea vomiting or dysphagia. She is on low-dose ASA and she says her heartburn is well controlled with PPI  Past Medical History  Diagnosis Date  . S/P tonsillectomy     1945  . History of nephrectomy     (right) for a non-functioning kidney in 1949  . S/P complete hysterectomy     1990  . Hypertension     for several years  . Fistula     rt arm for dialysis which is not in use at present  . Hemodialysis patient     since November 2005  . Gastric ulcer     back in 1989  . Parathyroid disorder     surgery in August 2011  . Rectocele     repair several years ago  . GERD (gastroesophageal reflux disease)   . S/P laparoscopic cholecystectomy     Past Surgical History  Procedure Date  . Abdominal hysterectomy 1990    Family History  Problem Relation Age of Onset  . Hypertension Mother   . Stroke Mother   . Hypertension Father   . Stroke Father   . Hypertension Daughter   . Hypertension Daughter   . Healthy Son    Social History:  reports that she quit smoking about 25 years ago. Her smoking use included Cigarettes. She has a 15 pack-year smoking history. She has never used smokeless tobacco. She reports that she does not drink alcohol or use illicit drugs.  Allergies: No Known Allergies  Medications Prior to Admission  Medication Dose Route Frequency Provider Last Rate Last Dose  . 0.9 %  sodium chloride infusion   Intravenous Continuous Malissa Hippo, MD 50 mL/hr at 10/20/11 1229    . meperidine (DEMEROL) 50 MG/ML injection           . midazolam (VERSED) 5 MG/5ML injection           . DISCONTD:  0.45 % sodium chloride infusion   Intravenous Once Malissa Hippo, MD       Medications Prior to Admission  Medication Sig Dispense Refill  . allopurinol (ZYLOPRIM) 100 MG tablet Take 100 mg by mouth daily.       Marland Kitchen amLODipine (NORVASC) 5 MG tablet Take 5 mg by mouth daily.       Marland Kitchen aspirin EC 81 MG tablet Take 81 mg by mouth daily.      Marland Kitchen lisinopril (PRINIVIL,ZESTRIL) 20 MG tablet Take 40 mg by mouth daily.      Marland Kitchen omeprazole (PRILOSEC) 20 MG capsule Take 20 mg by mouth daily.        . peg 3350 powder (MOVIPREP) 100 G SOLR Take 1 kit (100 g total) by mouth once.  1 kit  0  . acetaminophen (TYLENOL) 500 MG tablet Take 500 mg by mouth every 6 (six) hours as needed. For pain/fever      . calcium carbonate (TUMS - DOSED IN MG ELEMENTAL CALCIUM) 500 MG chewable tablet Chew 2 tablets by mouth as directed. Takes 2 tablets at each meal and snacks      . Multiple Vitamins-Minerals (  DIALYVITE 800/ULTRA D PO) Take 1 tablet by mouth daily. Take 1 Tablet Daily        No results found for this or any previous visit (from the past 48 hour(s)). No results found.  Review of Systems  Gastrointestinal: Negative for nausea, vomiting, abdominal pain, diarrhea, constipation, blood in stool and melena.    Blood pressure 166/69, temperature 98.2 F (36.8 C), temperature source Oral, resp. rate 21, height 5\' 4"  (1.626 m), weight 157 lb (71.215 kg), SpO2 96.00%. Physical Exam  Constitutional: She appears well-developed and well-nourished.  HENT:  Mouth/Throat: Oropharynx is clear and moist.  Eyes: Conjunctivae are normal. No scleral icterus.  Neck: No thyromegaly present.  Cardiovascular: Normal rate and regular rhythm.   Murmur (faint SEM at LLSB) heard. Respiratory: Effort normal and breath sounds normal.  GI: Soft. She exhibits no distension and no mass. There is no tenderness.       Small submucosal lesion possible lipoma at right mid abdomen  Musculoskeletal: She exhibits no edema.       Patient has  fistula at  right forearm  Lymphadenopathy:    She has no cervical adenopathy.  Neurological: She is alert.  Skin: Skin is warm and dry.     Assessment/Plan Anemia and heme positive stools. Remote history of gastric ulcer EGD in 2009 was negative for PUD. Diagnostic colonoscopy and if normal to be followed by EGD.  Julia Love 10/20/2011, 1:56 PM

## 2011-10-20 NOTE — Op Note (Signed)
COLONOSCOPY PROCEDURE REPORT  PATIENT:  Julia Love  MR#:  [id number] Birthdate:  07-16-1929, 78 y.o., female Endoscopist:  Dr. Malissa Hippo, MD Referred By:  Dr. Lauris Poag, MD Procedure Date: 10/20/2011  Procedure:   Colonoscopy with snare polypectomy and hemoclip clip application at cecum.  Indications:  Patient is 78 year old Caucasian female who was noted to have drop in her H&H and her workup found to have heme positive stools. She has received parenteral iron at the time of hemodialysis. She is undergoing diagnostic colonoscopy to be followed by EGD if indicated.  Informed Consent: Procedure and risks were reviewed with the patient and informed consent was obtained. Medications:  Demerol 50 mg IV Versed 5 mg IV  Description of procedure:  After a digital rectal exam was performed, that colonoscope was advanced from the anus through the rectum and colon to the area of the cecum, ileocecal valve and appendiceal orifice. The cecum was deeply intubated. These structures were well-seen and photographed for the record. From the level of the cecum and ileocecal valve, the scope was slowly and cautiously withdrawn. The mucosal surfaces were carefully surveyed utilizing scope tip to flexion to facilitate fold flattening as needed. The scope was pulled down into the rectum where a thorough exam including retroflexion was performed.  Findings:   Prep excellent. 10 mm broad-based polyp snared from cecum following saline injection. Minimal oozing noted. Two hemoclips applied to polypectomy site with good hemostasis. 4 x 7 mm polyp snared from proximal transverse colon. Two  5-6 mm polyp snared from the transverse colon and submitted in one container. 8 x 12 mm broad-based polyp with surface ulceration snared following saline injection. Polypectomy complete. 12 mm polyp snared from rectum. Two small polyps are coagulated; one at cecum and the second one was the transverse colon. Scattered  diverticuli of sigmoid and descending colon. Anorectal junction unremarkable.  Therapeutic/Diagnostic Maneuvers Performed:  See above.  Complications:  None  Cecal Withdrawal Time:  28 minutes  Impression:  Examination performed to cecum. Left-sided diverticulosis. Six  polyps were snared and tube were coagulated. Cecal polyp was 10 mm. 2 hemoclips applied to polypectomy site to prevent risk of polypectomy bleed. Polyp at transverse colon at surface ulceration lobectomy was complete.   Recommendations:  Standard instructions given. No aspirin for 10 days. I will be contacting the patient with the results of biopsy and further recommendations.  [name],[name]  10/20/2011 3:02 PM  CC: Dr. Harlow Asa, MD, MD & Dr. Bonnetta Barry ref. provider found          Dr. Casimiro Needle, MD

## 2011-10-23 ENCOUNTER — Telehealth (INDEPENDENT_AMBULATORY_CARE_PROVIDER_SITE_OTHER): Payer: Self-pay | Admitting: Internal Medicine

## 2011-10-23 NOTE — Telephone Encounter (Signed)
I went over the biopsy results the patient's daughter Earley Abide. Will see patient in the office on 10/28/2011 at 4 PM

## 2011-10-25 NOTE — Telephone Encounter (Signed)
Apt has been scheduled.

## 2011-10-26 ENCOUNTER — Encounter (HOSPITAL_COMMUNITY): Payer: Self-pay | Admitting: Internal Medicine

## 2011-10-28 ENCOUNTER — Ambulatory Visit (INDEPENDENT_AMBULATORY_CARE_PROVIDER_SITE_OTHER): Payer: Medicare Other | Admitting: Internal Medicine

## 2011-10-28 ENCOUNTER — Other Ambulatory Visit (INDEPENDENT_AMBULATORY_CARE_PROVIDER_SITE_OTHER): Payer: Self-pay | Admitting: *Deleted

## 2011-10-28 ENCOUNTER — Encounter (INDEPENDENT_AMBULATORY_CARE_PROVIDER_SITE_OTHER): Payer: Self-pay | Admitting: *Deleted

## 2011-10-28 ENCOUNTER — Encounter (INDEPENDENT_AMBULATORY_CARE_PROVIDER_SITE_OTHER): Payer: Self-pay | Admitting: Internal Medicine

## 2011-10-28 VITALS — BP 128/82 | HR 84 | Temp 98.7°F | Resp 12 | Ht 64.0 in | Wt 160.0 lb

## 2011-10-28 DIAGNOSIS — Z860100 Personal history of colon polyps, unspecified: Secondary | ICD-10-CM

## 2011-10-28 DIAGNOSIS — C189 Malignant neoplasm of colon, unspecified: Secondary | ICD-10-CM

## 2011-10-28 NOTE — Patient Instructions (Signed)
Imodium OTC or loperamide 1 mg every day after bowel movement. Fiber supplement 3-4 g daily. Colonoscopy to be scheduled.

## 2011-10-28 NOTE — Progress Notes (Signed)
Presenting complaint; Patient is here to discuss results of recent colonoscopy. Subjective: Patient is 78 year old Caucasian female who recently was noted to have drop in her H&H and heme-positive stools. She underwent colonoscopy on 10/20/2011. 6 polyps were were snared and 2 were coagulated. All of these polyps turn out to be adenomatous. Polyp from transverse colon turned out to have invasive carcinoma. Patient has not experienced any rectal bleeding. She says her hemoglobin at about a systemic was up 11 g. She does complain of intermittent postprandial diarrhea and at times she cannot even make it to the bathroom. She remains with good appetite Current Medications: Current Outpatient Prescriptions  Medication Sig Dispense Refill  . acetaminophen (TYLENOL) 500 MG tablet Take 500 mg by mouth every 6 (six) hours as needed. For pain/fever      . allopurinol (ZYLOPRIM) 100 MG tablet Take 100 mg by mouth daily.       Marland Kitchen amLODipine (NORVASC) 5 MG tablet Take 5 mg by mouth daily. Patient takes 1/2 tablet in the morning, 1 tablet at night      . aspirin 81 MG tablet Take 81 mg by mouth daily. Patient currently holding aspirin      . calcium carbonate (TUMS - DOSED IN MG ELEMENTAL CALCIUM) 500 MG chewable tablet Chew 2 tablets by mouth as directed. Takes 2 tablets at each meal and snacks      . Homeopathic Products (HEPAR COMPOSITUM IJ) Inject 10 mg as directed. Patient gets at dialysis      . lisinopril (PRINIVIL,ZESTRIL) 20 MG tablet Take 40 mg by mouth daily.      . Multiple Vitamins-Minerals (DIALYVITE 800/ULTRA D PO) Take 1 tablet by mouth daily. Take 1 Tablet Daily      . omeprazole (PRILOSEC) 20 MG capsule Take 20 mg by mouth daily.          Objective: Blood pressure 128/82, pulse 84, temperature 98.7 F (37.1 C), temperature source Oral, resp. rate 12, height 5\' 4"  (1.626 m), weight 160 lb (72.576 kg). Abdomen is full. Bowel sounds are normal. On palpation is soft and nontender without  organomegaly or masses.  Labs/studies Results: Biopsy results discussed with the patient and her daughter Earley Abide. Polyp at transverse colon is an adenoma but invasive carcinoma.  Assessment: Patient will need surgery since polyp at transverse colon has invasive carcinoma. She will need to have that site tattooed this spot dye. Patient and her daughter would like for her to see Dr. Zachery Dakins for surgery. Since her nephrologist is based in [place] it would be advisable for her to have surgery at [facility].   Plan: Will schedule her for repeat colonoscopy next week so that site can be tattooed prior to surgery. We'll make an appointment with Dr. Zachery Dakins following this procedure.

## 2011-11-04 ENCOUNTER — Encounter (HOSPITAL_COMMUNITY): Payer: Self-pay | Admitting: Pharmacy Technician

## 2011-11-04 MED ORDER — SODIUM CHLORIDE 0.45 % IV SOLN: Freq: Once | INTRAVENOUS | Status: DC

## 2011-11-05 ENCOUNTER — Ambulatory Visit (HOSPITAL_COMMUNITY)
Admission: RE | Admit: 2011-11-05 | Discharge: 2011-11-05 | Disposition: A | Discharge status: Home / Self Care | Payer: Medicare Other | Source: Ambulatory Visit | Attending: Internal Medicine | Admitting: Internal Medicine

## 2011-11-05 ENCOUNTER — Encounter (HOSPITAL_COMMUNITY): Payer: Self-pay | Admitting: *Deleted

## 2011-11-05 ENCOUNTER — Encounter (HOSPITAL_COMMUNITY)
Admission: RE | Disposition: A | Discharge status: Home / Self Care | Payer: Self-pay | Source: Ambulatory Visit | Attending: Internal Medicine

## 2011-11-05 ENCOUNTER — Telehealth (INDEPENDENT_AMBULATORY_CARE_PROVIDER_SITE_OTHER): Payer: Self-pay

## 2011-11-05 DIAGNOSIS — Z79899 Other long term (current) drug therapy: Secondary | ICD-10-CM | POA: Insufficient documentation

## 2011-11-05 DIAGNOSIS — I10 Essential (primary) hypertension: Secondary | ICD-10-CM | POA: Insufficient documentation

## 2011-11-05 DIAGNOSIS — C184 Malignant neoplasm of transverse colon: Secondary | ICD-10-CM | POA: Insufficient documentation

## 2011-11-05 DIAGNOSIS — C189 Malignant neoplasm of colon, unspecified: Secondary | ICD-10-CM

## 2011-11-05 DIAGNOSIS — K5730 Diverticulosis of large intestine without perforation or abscess without bleeding: Secondary | ICD-10-CM | POA: Insufficient documentation

## 2011-11-05 HISTORY — DX: Malignant (primary) neoplasm, unspecified: C80.1

## 2011-11-05 HISTORY — PX: COLONOSCOPY: SHX5424

## 2011-11-05 SURGERY — COLONOSCOPY
Anesthesia: Moderate Sedation

## 2011-11-05 MED ORDER — MIDAZOLAM HCL 5 MG/5ML IJ SOLN
INTRAMUSCULAR | Status: DC | PRN
  Administered 2011-11-05 (×2): 1 mg via INTRAVENOUS
  Administered 2011-11-05: 2 mg via INTRAVENOUS
  Administered 2011-11-05: 1 mg via INTRAVENOUS

## 2011-11-05 MED ORDER — MEPERIDINE HCL 50 MG/ML IJ SOLN
INTRAMUSCULAR | Status: DC | PRN
  Administered 2011-11-05 (×2): 25 mg via INTRAVENOUS

## 2011-11-05 MED ORDER — SODIUM CHLORIDE 0.9 % IV SOLN
INTRAVENOUS | Status: DC
  Administered 2011-11-05: 07:00:00 via INTRAVENOUS

## 2011-11-05 MED ORDER — MEPERIDINE HCL 50 MG/ML IJ SOLN
INTRAMUSCULAR | Status: AC
  Filled 2011-11-05: qty 1

## 2011-11-05 MED ORDER — MIDAZOLAM HCL 5 MG/5ML IJ SOLN
INTRAMUSCULAR | Status: AC
  Filled 2011-11-05: qty 10

## 2011-11-05 NOTE — Telephone Encounter (Signed)
Dr. Karilyn Cota called regarding this patient.  Pt had a colonoscopy on 2/6 which showed polyps.  Pt was seen today and polyps were injected at hepatic flexure.  Dr. Karilyn Cota would like the pt seen and scheduled for laparoscopic surgery.  Dr. Abbey Chatters made aware.

## 2011-11-05 NOTE — H&P (Signed)
Julia Love is an 78 y.o. female.   Chief Complaint: Patient is here for colonoscopy. HPI: Patient is 78 year old Caucasian female who underwent colonoscopy on 03/02/2012 because of heme-positive stools and dropped her H&H. She was found to have multiple polyps. Sessile polyp removed from the transverse colon which has invasive carcinoma. She is to return to have the site tattooed prior to surgery. She has occasional diarrhea with urgency. Her hemoglobin checked at the dialysis clinic and it is up.   Past Medical History  Diagnosis Date  . S/P tonsillectomy     1945  . History of nephrectomy     (right) for a non-functioning kidney in 1949  . S/P complete hysterectomy     1990  . Hypertension     for several years  . Fistula     rt arm for dialysis which is not in use at present  . Hemodialysis patient     since November 2005  . Gastric ulcer     back in 1989  . Parathyroid disorder     surgery in August 2011  . Rectocele     repair several years ago  . GERD (gastroesophageal reflux disease)   . S/P laparoscopic cholecystectomy   . Cancer     Colon cancer    Past Surgical History  Procedure Date  . Abdominal hysterectomy 1990  . Colonoscopy 10/20/2011    Procedure: COLONOSCOPY;  Surgeon: Malissa Hippo, MD;  [facility];  Service: Endoscopy;  Laterality: N/A;  100  . Esophagogastroduodenoscopy 10/20/2011    Procedure: ESOPHAGOGASTRODUODENOSCOPY (EGD);  Surgeon: Malissa Hippo, MD;  Location: [facility];  Service: Endoscopy;  Laterality: N/A;  Possible EGD    Family History  Problem Relation Age of Onset  . Hypertension Mother   . Stroke Mother   . Hypertension Father   . Stroke Father   . Hypertension Daughter   . Hypertension Daughter   . Healthy Son   . Colon cancer Neg Hx    Social History:  reports that she quit smoking about 25 years ago. Her smoking use included Cigarettes. She has a 15 pack-year smoking history. She has never used smokeless  tobacco. She reports that she does not drink alcohol or use illicit drugs.  Allergies: No Known Allergies  Medications Prior to Admission  Medication Dose Route Frequency Provider Last Rate Last Dose  . 0.9 %  sodium chloride infusion   Intravenous Continuous Malissa Hippo, MD 20 mL/hr at 11/05/11 0716    . meperidine (DEMEROL) 50 MG/ML injection           . midazolam (VERSED) 5 MG/5ML injection           . DISCONTD: 0.45 % sodium chloride infusion   Intravenous Once Malissa Hippo, MD       Medications Prior to Admission  Medication Sig Dispense Refill  . acetaminophen (TYLENOL) 500 MG tablet Take 500 mg by mouth every 6 (six) hours as needed. For pain/fever      . allopurinol (ZYLOPRIM) 100 MG tablet Take 100 mg by mouth daily.       Marland Kitchen amLODipine (NORVASC) 5 MG tablet Take 5 mg by mouth at bedtime.       . calcium carbonate (TUMS - DOSED IN MG ELEMENTAL CALCIUM) 500 MG chewable tablet Chew 2 tablets by mouth as directed. Takes 2 tablets at each meal and 1 tablet with snacks      . lisinopril (PRINIVIL,ZESTRIL) 20  MG tablet Take 40 mg by mouth at bedtime.       . methylcellulose oral powder Take 1 packet by mouth daily.      . Multiple Vitamins-Minerals (DIALYVITE 800/ULTRA D PO) Take 1 tablet by mouth daily.       Marland Kitchen omeprazole (PRILOSEC) 20 MG capsule Take 20 mg by mouth daily.          No results found for this or any previous visit (from the past 48 hour(s)). No results found.  Review of Systems  Constitutional: Negative for weight loss.  Gastrointestinal: Negative for abdominal pain, constipation, blood in stool and melena.    Blood pressure 160/60, pulse 73, temperature 98.1 F (36.7 C), temperature source Oral, resp. rate 18, height 5\' 4"  (1.626 m), weight 160 lb (72.576 kg), SpO2 95.00%. Physical Exam  Constitutional: She appears well-developed and well-nourished.  HENT:  Mouth/Throat: Oropharynx is clear and moist.  Eyes: Conjunctivae are normal. No scleral icterus.    Neck: No thyromegaly present.  Cardiovascular: Normal rate and regular rhythm.   Murmur heard. Respiratory: Effort normal.  GI: Soft. She exhibits no mass. There is no tenderness.  Musculoskeletal: She exhibits no edema.  Lymphadenopathy:    She has no cervical adenopathy.  Neurological: She is alert.  Skin: Skin is warm and dry.     Assessment/Plan History of colonic adenoma with invasive carcinoma.  Patient is undergoing colonoscopy to tattoo the site.  Emmet Messer U 11/05/2011, 7:37 AM

## 2011-11-05 NOTE — Discharge Instructions (Signed)
Presumed usual medications and diet. No driving for 24 hours. We'll make an appointment with Dr. Avel Peace.Colonoscopy A colonoscopy is an exam to evaluate your entire colon. In this exam, your colon is cleansed. A long fiberoptic tube is inserted through your rectum and into your colon. The fiberoptic scope (endoscope) is a long bundle of enclosed and very flexible fibers. These fibers transmit light to the area examined and send images from that area to your caregiver. Discomfort is usually minimal. You may be given a drug to help you sleep (sedative) during or prior to the procedure. This exam helps to detect lumps (tumors), polyps, inflammation, and areas of bleeding. Your caregiver may also take a small piece of tissue (biopsy) that will be examined under a microscope. LET YOUR CAREGIVER KNOW ABOUT:   Allergies to food or medicine.   Medicines taken, including vitamins, herbs, eyedrops, over-the-counter medicines, and creams.   Use of steroids (by mouth or creams).   Previous problems with anesthetics or numbing medicines.   History of bleeding problems or blood clots.   Previous surgery.   Other health problems, including diabetes and kidney problems.   Possibility of pregnancy, if this applies.  BEFORE THE PROCEDURE   A clear liquid diet may be required for 2 days before the exam.   Ask your caregiver about changing or stopping your regular medications.   Liquid injections (enemas) or laxatives may be required.   A large amount of electrolyte solution may be given to you to drink over a short period of time. This solution is used to clean out your colon.   You should be present 60 minutes prior to your procedure or as directed by your caregiver.  AFTER THE PROCEDURE   If you received a sedative or pain relieving medication, you will need to arrange for someone to drive you home.   Occasionally, there is a little blood passed with the first bowel movement. Do not be  concerned.  FINDING OUT THE RESULTS OF YOUR TEST Not all test results are available during your visit. If your test results are not back during the visit, make an appointment with your caregiver to find out the results. Do not assume everything is normal if you have not heard from your caregiver or the medical facility. It is important for you to follow up on all of your test results. HOME CARE INSTRUCTIONS   It is not unusual to pass moderate amounts of gas and experience mild abdominal cramping following the procedure. This is due to air being used to inflate your colon during the exam. Walking or a warm pack on your belly (abdomen) may help.   You may resume all normal meals and activities after sedatives and medicines have worn off.   Only take over-the-counter or prescription medicines for pain, discomfort, or fever as directed by your caregiver. Do not use aspirin or blood thinners if a biopsy was taken. Consult your caregiver for medicine usage if biopsies were taken.  SEEK IMMEDIATE MEDICAL CARE IF:   You have a fever.   You pass large blood clots or fill a toilet with blood following the procedure. This may also occur 10 to 14 days following the procedure. This is more likely if a biopsy was taken.   You develop abdominal pain that keeps getting worse and cannot be relieved with medicine.  Document Released: 08/27/2000 Document Revised: 05/12/2011 Document Reviewed: 04/11/2008 [facility] Patient Information 2012 [place], [place].

## 2011-11-05 NOTE — Op Note (Signed)
COLONOSCOPY PROCEDURE REPORT  PATIENT:  Julia Love  MR#:  [id number] Birthdate:  1929-08-10, 78 y.o., female Endoscopist:  Dr. Malissa Hippo, MD Referred By:  Dr. Laural Golden, MD Procedure Date: 11/05/2011  Procedure:   Colonoscopy to tattoo polypectomy site prior to surgery.  Indications:  Patient is a 35-year-old Caucasian female undergoing colonoscopy on 10/20/2011 because of heme-positive stool in her H&H. She had multiple polyps removed. Polyp from the transverse colon was tubular adenoma with invasive carcinoma. She is returning to have the site tattooed preoperatively.  Informed Consent:  Procedure and risks were reviewed with the patient and informed consent was obtained. Medications:  Demerol 50 mg IV Versed 5  mg IV  Description of procedure:  After a digital rectal exam was performed, that colonoscope was advanced from the anus through the rectum and colon to the area of the cecum, ileocecal valve and appendiceal orifice. The cecum was deeply intubated. These structures were well-seen and photographed for the record. From the level of the cecum and ileocecal valve, the scope was slowly and cautiously withdrawn. The mucosal surfaces were carefully surveyed utilizing scope tip to flexion to facilitate fold flattening as needed.   Findings:   Prep satisfactory. Single Hemoclip noted at cecal polypectomy site. Other clip has passed. 2 polypectomy sites noted at proximal transverse colon/hepatic flexure. Proximal site corresponded to positive and invasive carcinoma. Spot dye was injected submucosally above between the two sites and distal to the benign polyp. Scattered diverticula at sigmoid and descending colon. Polypectomy site and rectum with clean base.  Therapeutic/Diagnostic Maneuvers Performed:  Spot dye was injected at 2 sites proximal to  index polypectomy site. Spot dye was also injected distal to the second polypectomy site in between the 2. Multiple pictures  taken for the record.  Complications:  None  Cecal Withdrawal Time:  11 minutes  Impression:  Spot dye injected above and below the polypectomy site which appears to be proximal transverse colon/hepatic flexure. One of the two hemoclips applied at cecal polypectomy site has passed. Left-sided diverticulosis.  Recommendations:  Surgical consultation with Dr. Avel Peace of [facility] [place] [facility]  [name]  11/05/2011 8:14 AM  CC: Dr. Harlow Asa, MD, MD & Dr. Bonnetta Barry ref. provider found

## 2011-11-10 ENCOUNTER — Encounter (INDEPENDENT_AMBULATORY_CARE_PROVIDER_SITE_OTHER): Payer: Self-pay | Admitting: General Surgery

## 2011-11-10 ENCOUNTER — Other Ambulatory Visit (INDEPENDENT_AMBULATORY_CARE_PROVIDER_SITE_OTHER): Payer: Self-pay | Admitting: General Surgery

## 2011-11-10 ENCOUNTER — Ambulatory Visit (INDEPENDENT_AMBULATORY_CARE_PROVIDER_SITE_OTHER): Payer: Medicare Other | Admitting: General Surgery

## 2011-11-10 ENCOUNTER — Telehealth (INDEPENDENT_AMBULATORY_CARE_PROVIDER_SITE_OTHER): Payer: Self-pay | Admitting: *Deleted

## 2011-11-10 VITALS — BP 156/80 | HR 80 | Temp 99.0°F | Resp 18 | Ht 64.0 in | Wt 158.2 lb

## 2011-11-10 DIAGNOSIS — K635 Polyp of colon: Secondary | ICD-10-CM

## 2011-11-10 DIAGNOSIS — C189 Malignant neoplasm of colon, unspecified: Secondary | ICD-10-CM

## 2011-11-10 NOTE — Progress Notes (Signed)
Patient ID: Julia Love, female   DOB: 23-Oct-1928, 78 y.o.   MRN: [id number]  Chief Complaint  Patient presents with  . Colon Polyps    new pt    HPI Julia Love is a 78 y.o. female.   HPI  She is referred by Dr. Karilyn Cota for further evaluation and treatment of proximal transverse colon cancer.  She was noted to be anemic and had hemoccult positive stools.  She subsequently underwent a colonoscopy which demonstrated multiple polyps which were removed.  A polyp in the transverse colon was a tubular adenoma with invasive adenocarcinoma in it.  The carcinoma involved the cauterized specimen.  Dr. Karilyn Cota recommended partial colectomy.  She had a repeat colonoscopy recently and the polyp site was tattooed.  She has been given IV Iron and her hemoglobin is around 11 now by her report.  No family h/o colon cancer.  She is here with her daughter.  She also has a mole in th right upper back that is itching and she would like to have this removed.  Past Medical History  Diagnosis Date  . S/P tonsillectomy     1945  . History of nephrectomy     (right) for a non-functioning kidney in 1949  . S/P complete hysterectomy     1990  . Hypertension     for several years  . Fistula     rt arm for dialysis which is not in use at present  . Hemodialysis patient     since November 2005  . Gastric ulcer     back in 1989  . Parathyroid disorder     surgery in August 2011  . Rectocele     repair several years ago  . GERD (gastroesophageal reflux disease)   . S/P laparoscopic cholecystectomy   . Cancer     Colon cancer    Past Surgical History  Procedure Date  . Abdominal hysterectomy 1990  . Colonoscopy 10/20/2011    Procedure: COLONOSCOPY;  Surgeon: Malissa Hippo, MD;  [facility];  Service: Endoscopy;  Laterality: N/A;  100  . Esophagogastroduodenoscopy 10/20/2011    Procedure: ESOPHAGOGASTRODUODENOSCOPY (EGD);  Surgeon: Malissa Hippo, MD;  [facility];  Service:  Endoscopy;  Laterality: N/A;  Possible EGD    Family History  Problem Relation Age of Onset  . Hypertension Mother   . Stroke Mother   . Hypertension Father   . Stroke Father   . Hypertension Daughter   . Hypertension Daughter   . Healthy Son   . Colon cancer Neg Hx     Social History History  Substance Use Topics  . Smoking status: Former Smoker -- 1.0 packs/day for 15 years    Types: Cigarettes    Quit date: 07/11/1986  . Smokeless tobacco: Never Used   Comment: Patient smoked 1 pack a day  . Alcohol Use: No    No Known Allergies  Current Outpatient Prescriptions  Medication Sig Dispense Refill  . allopurinol (ZYLOPRIM) 100 MG tablet Take 100 mg by mouth daily.       Marland Kitchen amLODipine (NORVASC) 5 MG tablet Take 5 mg by mouth at bedtime.       Marland Kitchen aspirin EC 81 MG tablet Take 81 mg by mouth daily.      . calcium carbonate (TUMS - DOSED IN MG ELEMENTAL CALCIUM) 500 MG chewable tablet Chew 2 tablets by mouth as directed. Takes 2 tablets at each meal and 1 tablet with  snacks      . lisinopril (PRINIVIL,ZESTRIL) 20 MG tablet Take 40 mg by mouth at bedtime.       . methylcellulose oral powder Take 1 packet by mouth daily.      . Multiple Vitamins-Minerals (DIALYVITE 800/ULTRA D PO) Take 1 tablet by mouth daily.       Marland Kitchen omeprazole (PRILOSEC) 20 MG capsule Take 20 mg by mouth daily.          Review of Systems Review of Systems  Constitutional: Negative for fever, chills and unexpected weight change.  HENT: Negative for congestion.   Respiratory: Negative for shortness of breath.   Cardiovascular: Negative for chest pain.  Gastrointestinal: Positive for blood in stool. Negative for abdominal pain, abdominal distention and rectal pain.  Genitourinary: Negative for dysuria and difficulty urinating.  Neurological: Negative for seizures.  Hematological: Does not bruise/bleed easily.  Psychiatric/Behavioral: Negative for behavioral problems and agitation.    Blood pressure 156/80,  pulse 80, temperature 99 F (37.2 C), temperature source Temporal, resp. rate 18, height 5\' 4"  (1.626 m), weight 158 lb 3.2 oz (71.759 kg).  Physical Exam Physical Exam  Constitutional:       Elderly female in NAD.  HENT:  Head: Normocephalic and atraumatic.  Eyes: Conjunctivae and EOM are normal.  Neck: Neck supple.  Cardiovascular: Normal rate and regular rhythm.   Murmur heard. Pulmonary/Chest: Effort normal and breath sounds normal.  Abdominal: Soft. She exhibits no distension and no mass. There is no tenderness.       Lower transverse scar. Small upper abdominal scars.  Musculoskeletal: She exhibits no edema.       1.5 cm dark irregular mole right upper back.  Lymphadenopathy:    She has no cervical adenopathy.  Neurological: She is alert. She exhibits normal muscle tone.  Skin: Skin is warm and dry. No rash noted.  Psychiatric: She has a normal mood and affect. Her behavior is normal.    Data Reviewed Colonoscopy report.  Pathology report.  Assessment    Newly diagnosed proximal transverse colon cancer.  She is interested in partial colectomy.  She will need a staging preop CT scan.  She would also like the mole to be removed.  She has hemodialysis every Tuesday, Thursday, and Saturday. The operation did be scheduled on Wednesday or a Friday [facility].    Plan    Schedule CT scan. Laparoscopic assisted partial colectomy and removal of mole on right upper back.  I have explained the procedure and risks of colon resection.  Risks include but are not limited to bleeding, infection, wound problems, anesthesia, anastomotic leak, need for colostomy, injury to intraabominal organs (such as intestine, spleen, kidney, bladder, ureter, etc.), ileus, irregular bowel habits.  She seems to understand and agrees to proceed.  We also discussed the possibility that she may need a rehabilitation center  or short-term skilled nursing facility after the operation.        Mikelle Myrick J 11/10/2011, 8:36 AM

## 2011-11-10 NOTE — Patient Instructions (Addendum)
Pickup Moviprep from your pharmacy.

## 2011-11-10 NOTE — Telephone Encounter (Signed)
Julia Love called and would like to speak with Dr. Karilyn Cota about her visit with Dr. Avel Peace.  Please return her call to [phone].

## 2011-11-11 ENCOUNTER — Other Ambulatory Visit (INDEPENDENT_AMBULATORY_CARE_PROVIDER_SITE_OTHER): Payer: Self-pay | Admitting: General Surgery

## 2011-11-12 LAB — CREATININE, SERUM: Creat: 5.15 mg/dL — ABNORMAL HIGH (ref 0.50–1.10)

## 2011-11-12 NOTE — Telephone Encounter (Signed)
Patient's called returned yesterday. She is scheduled to have abdominal CT in 2 weeks and surgery on March 22 nd.

## 2011-11-16 ENCOUNTER — Other Ambulatory Visit (HOSPITAL_COMMUNITY): Payer: Medicare Other

## 2011-11-19 ENCOUNTER — Ambulatory Visit (HOSPITAL_COMMUNITY)
Admission: RE | Admit: 2011-11-19 | Discharge: 2011-11-19 | Disposition: A | Discharge status: Home / Self Care | Payer: Medicare Other | Source: Ambulatory Visit | Attending: General Surgery | Admitting: General Surgery

## 2011-11-19 DIAGNOSIS — C189 Malignant neoplasm of colon, unspecified: Secondary | ICD-10-CM | POA: Insufficient documentation

## 2011-11-19 DIAGNOSIS — R161 Splenomegaly, not elsewhere classified: Secondary | ICD-10-CM | POA: Insufficient documentation

## 2011-11-19 DIAGNOSIS — K7689 Other specified diseases of liver: Secondary | ICD-10-CM | POA: Insufficient documentation

## 2011-11-19 MED ORDER — IOHEXOL 300 MG/ML  SOLN
100.0000 mL | Freq: Once | INTRAMUSCULAR | Status: AC | PRN
  Administered 2011-11-19: 100 mL via INTRAVENOUS

## 2011-11-22 ENCOUNTER — Encounter (HOSPITAL_COMMUNITY): Payer: Self-pay | Admitting: Pharmacy Technician

## 2011-11-22 ENCOUNTER — Encounter (INDEPENDENT_AMBULATORY_CARE_PROVIDER_SITE_OTHER): Payer: Self-pay

## 2011-11-23 ENCOUNTER — Telehealth (INDEPENDENT_AMBULATORY_CARE_PROVIDER_SITE_OTHER): Payer: Self-pay | Admitting: General Surgery

## 2011-11-23 NOTE — Telephone Encounter (Signed)
The CT scan demonstrates findings consistent with a cirrhotic liver, a 2.5 cm ill defined solid mass in the liver, and mild splenomegaly.  The cirrhosis is a new diagnosis.  I feel we need to evaluate the liver mass and cirrhosis prior to doing an operation if we do one at all given these recent findings.  The liver mass could be benign, a metastatic lesion, or primary hepatocellular carcinoma.  Will order an MRI and some labs and refer her back to Dr. Karilyn Cota for evaluation of her cirrhosis.

## 2011-11-24 ENCOUNTER — Other Ambulatory Visit (INDEPENDENT_AMBULATORY_CARE_PROVIDER_SITE_OTHER): Payer: Self-pay

## 2011-11-24 ENCOUNTER — Inpatient Hospital Stay (HOSPITAL_COMMUNITY): Admission: RE | Admit: 2011-11-24 | Payer: Medicare Other | Source: Ambulatory Visit

## 2011-11-24 ENCOUNTER — Telehealth (INDEPENDENT_AMBULATORY_CARE_PROVIDER_SITE_OTHER): Payer: Self-pay

## 2011-11-24 DIAGNOSIS — R160 Hepatomegaly, not elsewhere classified: Secondary | ICD-10-CM

## 2011-11-24 NOTE — Telephone Encounter (Signed)
Pt's daughter calling to return call to Dr Abbey Chatters about cx surgery. Per Dr Maris Berger note daughter advised of abn ct and need for mri,labs and appt with GI md to eval before we can proceed with surgery. Julia Love received orders from Dr Abbey Chatters and is working on setting up these tests. She will call daughter once tests are scheduled.

## 2011-11-26 ENCOUNTER — Telehealth (INDEPENDENT_AMBULATORY_CARE_PROVIDER_SITE_OTHER): Payer: Self-pay | Admitting: *Deleted

## 2011-11-26 NOTE — Telephone Encounter (Signed)
Dr. Karilyn Cota, Neysa Bonito from Dr. Christ Kick office called [phone]). He is wanting the patient to see you as soon as possible. He is planning for her to have MRI at [facility] next Monday or Tuesday.  Advise [place] when you can see Julia Love.

## 2011-11-29 ENCOUNTER — Ambulatory Visit (HOSPITAL_COMMUNITY)
Admission: RE | Admit: 2011-11-29 | Discharge: 2011-11-29 | Disposition: A | Discharge status: Home / Self Care | Payer: Medicare Other | Source: Ambulatory Visit | Attending: General Surgery | Admitting: General Surgery

## 2011-11-29 DIAGNOSIS — R160 Hepatomegaly, not elsewhere classified: Secondary | ICD-10-CM

## 2011-11-29 DIAGNOSIS — C189 Malignant neoplasm of colon, unspecified: Secondary | ICD-10-CM | POA: Insufficient documentation

## 2011-11-29 DIAGNOSIS — K7460 Unspecified cirrhosis of liver: Secondary | ICD-10-CM | POA: Insufficient documentation

## 2011-11-29 DIAGNOSIS — K769 Liver disease, unspecified: Secondary | ICD-10-CM | POA: Insufficient documentation

## 2011-11-29 DIAGNOSIS — Z992 Dependence on renal dialysis: Secondary | ICD-10-CM | POA: Insufficient documentation

## 2011-11-30 NOTE — Telephone Encounter (Signed)
Please make an appointment as soon as MRI is done

## 2011-12-01 ENCOUNTER — Telehealth (INDEPENDENT_AMBULATORY_CARE_PROVIDER_SITE_OTHER): Payer: Self-pay | Admitting: General Surgery

## 2011-12-01 ENCOUNTER — Other Ambulatory Visit (INDEPENDENT_AMBULATORY_CARE_PROVIDER_SITE_OTHER): Payer: Self-pay | Admitting: General Surgery

## 2011-12-01 DIAGNOSIS — Z7901 Long term (current) use of anticoagulants: Secondary | ICD-10-CM

## 2011-12-01 DIAGNOSIS — C189 Malignant neoplasm of colon, unspecified: Secondary | ICD-10-CM

## 2011-12-01 NOTE — Telephone Encounter (Signed)
I spoke with her about her MRI results as well as discussion we had about her in the gastric intestinal cancer conference today.  The MRI demonstrates findings consistent with cirrhosis. The liver mass looks like a complex cyst and not a metastasis. The consensus from the cancer conference was that given her other comorbidity a partial colectomy may be too risky for her. The recommendation was to have a repeat colonoscopy in 3-6 months and monitor this area. We went over her back to Dr. Karilyn Cota for this and also for further evaluation of her cirrhosis. I will obtain some liver function tests and INR on her.

## 2011-12-01 NOTE — Telephone Encounter (Signed)
Apt has been scheduled for 12/06/11 at 11:30 am with Dr. Karilyn Cota

## 2011-12-02 ENCOUNTER — Other Ambulatory Visit (INDEPENDENT_AMBULATORY_CARE_PROVIDER_SITE_OTHER): Payer: Self-pay | Admitting: General Surgery

## 2011-12-03 ENCOUNTER — Encounter (HOSPITAL_COMMUNITY): Admission: RE | Payer: Self-pay | Source: Ambulatory Visit

## 2011-12-03 ENCOUNTER — Ambulatory Visit (HOSPITAL_COMMUNITY): Admission: RE | Admit: 2011-12-03 | Payer: Medicare Other | Source: Ambulatory Visit | Admitting: General Surgery

## 2011-12-03 LAB — APTT: aPTT: 28 seconds (ref 24–37)

## 2011-12-03 LAB — CBC
HCT: 36.4 % (ref 36.0–46.0)
RDW: 14.5 % (ref 11.5–15.5)
WBC: 6.4 10*3/uL (ref 4.0–10.5)

## 2011-12-03 LAB — COMPREHENSIVE METABOLIC PANEL
ALT: 22 U/L (ref 0–35)
AST: 24 U/L (ref 0–37)
Albumin: 4.4 g/dL (ref 3.5–5.2)
BUN: 14 mg/dL (ref 6–23)
Calcium: 10 mg/dL (ref 8.4–10.5)
Chloride: 97 mEq/L (ref 96–112)
Potassium: 3.9 mEq/L (ref 3.5–5.3)
Total Protein: 6.1 g/dL (ref 6.0–8.3)

## 2011-12-03 SURGERY — LAPAROSCOPIC PARTIAL COLECTOMY
Anesthesia: General

## 2011-12-06 ENCOUNTER — Encounter (INDEPENDENT_AMBULATORY_CARE_PROVIDER_SITE_OTHER): Payer: Self-pay | Admitting: Internal Medicine

## 2011-12-06 ENCOUNTER — Other Ambulatory Visit (INDEPENDENT_AMBULATORY_CARE_PROVIDER_SITE_OTHER): Payer: Self-pay | Admitting: Internal Medicine

## 2011-12-06 ENCOUNTER — Ambulatory Visit (INDEPENDENT_AMBULATORY_CARE_PROVIDER_SITE_OTHER): Payer: Medicare Other | Admitting: Internal Medicine

## 2011-12-06 VITALS — BP 140/74 | HR 80 | Temp 97.6°F | Resp 18 | Ht 64.0 in | Wt 159.9 lb

## 2011-12-06 DIAGNOSIS — K7689 Other specified diseases of liver: Secondary | ICD-10-CM

## 2011-12-06 DIAGNOSIS — Z7289 Other problems related to lifestyle: Secondary | ICD-10-CM

## 2011-12-06 DIAGNOSIS — K7460 Unspecified cirrhosis of liver: Secondary | ICD-10-CM

## 2011-12-06 DIAGNOSIS — Z789 Other specified health status: Secondary | ICD-10-CM

## 2011-12-06 DIAGNOSIS — K769 Liver disease, unspecified: Secondary | ICD-10-CM

## 2011-12-06 NOTE — Patient Instructions (Signed)
Physician will contact you with results of blood work. 

## 2011-12-06 NOTE — Progress Notes (Signed)
Presenting complaint;  Patient is here to discuss results of CT and MRI And further workup of newly diagnosed cirrhosis. Subjective:  Julia Love is 78 year old Caucasian female who was recently evaluated for heme positive stools and anemia and found to have multiple polyps. One of these polyps in proximal transverse colon turned out to be  an adenoma and invasive carcinoma. She was therefore referred to Dr. Thomes Lolling of [facility] surgery. She had preop abdominopelvic CT revealing cirrhotic appearing liver and 2.5 cm lesion in right hepatic lobe. She had splenomegaly atrophic and cystic solitary left kidney(she had right nephrectomy several years ago). With finding of cirrhosis and liver lesion surgery was canceled. She was scheduled for an MRI and this visit. She feels fine. He denies abdominal pain nausea or vomiting. Her bowels are moving better with fiber supplement. He denies rectal bleeding or melena. There is no history of anicteric hepatitis, tattoos or blood transfusion. There is also no history of elevated transaminases in the past. He had laparoscopic cholecystectomy in August or September 2008 and reportedly her liver was normal. Him history is negative for liver disease. At one point there was a question of whether or not she was diabetic her glucose levels have been within normal limits lately. No history of alcohol intake in the past.  Current Medications: Current Outpatient Prescriptions  Medication Sig Dispense Refill  . allopurinol (ZYLOPRIM) 100 MG tablet Take 100 mg by mouth daily.       Marland Kitchen amLODipine (NORVASC) 5 MG tablet Take 5 mg by mouth at bedtime.       . calcium carbonate (TUMS - DOSED IN MG ELEMENTAL CALCIUM) 500 MG chewable tablet Chew 2 tablets by mouth as directed. Takes 2 tablets at each meal and 1 tablet with snacks      . FIBER FORMULA PO Take by mouth. Patient states that she takes 2 tablespoons up to 2-3 times daily      . lisinopril (PRINIVIL,ZESTRIL) 20 MG  tablet Take 40 mg by mouth at bedtime.       . Multiple Vitamins-Minerals (DIALYVITE 800/ULTRA D PO) Take 1 tablet by mouth daily.       Marland Kitchen omeprazole (PRILOSEC) 20 MG capsule Take 20 mg by mouth daily.       Marland Kitchen aspirin EC 81 MG tablet Take 81 mg by mouth daily.      . methylcellulose oral powder Take 1 packet by mouth daily.       Past medical history; She was treated for gastric ulcer by me back in 1989. Right nephrectomy for nonfunctioning kidney in  1949. Attention of several years duration. End-stage renal disease. Patient has been on dialysis since November 2005. Chronic GERD. Laprascopic cholecystectomy in August or September 2008. Tonsillectomy  In 1945. Recent diagnosis of invasive carcinoma and colonic polyp as above.   Objective: Blood pressure 140/74, pulse 80, temperature 97.6 F (36.4 C), temperature source Oral, resp. rate 18, height 5\' 4"  (1.626 m), weight 159 lb 14.4 oz (72.53 kg). Patient is alert does not have asterixis. She has mild tremors to both hands. Conjunctiva is pink. Sclera is nonicteric Oropharyngeal mucosa is normal. No neck masses or thyromegaly noted. No spider angiomata noted. Cardiac exam with regular rhythm normal S1 and S2. She has short systolic murmur at left lower sternal border. Lungs are clear to auscultation. Abdomen is full. Sounds are normal. On palpation soft abdomen mild tenderness at right upper quadrant. Liver edge is indistinct. Liver does not appear to be enlarged.  Spleen is not palpable. No LE edema or clubbing noted.  Labs/studies Results: CT and MRI films reviewed with the patient and her daughter. MRI shows ages of cirrhosis and 1.9 cm T2 hyperintense lesion in the right hepatic lobe consistent with benign hemangioma or a cyst. That he was performed without contrast. CEA on 12/02/2011 was 4.5.   Assessment:  #1 cirrhosis. This is a new diagnosis. Patient's liver report he was normal at the time of laparoscopic cholecystectomy  4-1/2 years ago. She does not have any risk factors for chronic liver disease. Presently her hepatic function appears to be preserved. #2. The liver lesion appears to be benign. I do not feel further workup is warranted but will review imaging studies with Dr. Tyron Russell. #3. Invasive carcinoma in a colonic polyp. She is  high risk given that she has cirrhosis among other things.    Plan:  She will go to the lab for INR, ANA, sedimentation rate, smooth muscle antibody, AMA, alpha-1 antitrypsin and serum ferritin level. Will also check hepatitis B surface antigen, but that is B. surface antibody and hepatitis C  Antibody. We'll also request cholecystectomy off note from [facility] in [facility].

## 2011-12-10 LAB — HEPATITIS B SURFACE ANTIGEN: Hepatitis B Surface Ag: NEGATIVE

## 2011-12-10 LAB — ANTI-SMOOTH MUSCLE ANTIBODY, IGG: Smooth Muscle Ab: 2 U (ref <20)

## 2011-12-10 LAB — ALPHA-1-ANTITRYPSIN: A-1 Antitrypsin, Ser: 98 mg/dL (ref 90–200)

## 2011-12-13 LAB — HEPATITIS C ANTIBODY: HCV Ab: NEGATIVE

## 2011-12-14 ENCOUNTER — Other Ambulatory Visit (INDEPENDENT_AMBULATORY_CARE_PROVIDER_SITE_OTHER): Payer: Self-pay | Admitting: *Deleted

## 2011-12-14 DIAGNOSIS — K769 Liver disease, unspecified: Secondary | ICD-10-CM

## 2011-12-14 LAB — ANA: Anti Nuclear Antibody(ANA): NEGATIVE

## 2011-12-17 ENCOUNTER — Ambulatory Visit (HOSPITAL_COMMUNITY)
Admission: RE | Admit: 2011-12-17 | Discharge: 2011-12-17 | Disposition: A | Discharge status: Home / Self Care | Payer: Medicare Other | Source: Ambulatory Visit | Attending: Internal Medicine | Admitting: Internal Medicine

## 2011-12-17 DIAGNOSIS — K769 Liver disease, unspecified: Secondary | ICD-10-CM

## 2011-12-17 DIAGNOSIS — Z992 Dependence on renal dialysis: Secondary | ICD-10-CM | POA: Insufficient documentation

## 2011-12-17 DIAGNOSIS — K7689 Other specified diseases of liver: Secondary | ICD-10-CM | POA: Insufficient documentation

## 2011-12-17 MED ORDER — IOHEXOL 300 MG/ML  SOLN
100.0000 mL | Freq: Once | INTRAMUSCULAR | Status: AC | PRN
  Administered 2011-12-17: 100 mL via INTRAVENOUS

## 2011-12-24 ENCOUNTER — Encounter (INDEPENDENT_AMBULATORY_CARE_PROVIDER_SITE_OTHER): Payer: Self-pay

## 2011-12-28 ENCOUNTER — Encounter (INDEPENDENT_AMBULATORY_CARE_PROVIDER_SITE_OTHER): Payer: Self-pay

## 2012-03-13 ENCOUNTER — Other Ambulatory Visit: Payer: Self-pay

## 2012-03-13 DIAGNOSIS — T82598A Other mechanical complication of other cardiac and vascular devices and implants, initial encounter: Secondary | ICD-10-CM

## 2012-03-28 ENCOUNTER — Encounter: Payer: Self-pay | Admitting: Vascular Surgery

## 2012-03-29 ENCOUNTER — Encounter: Payer: Self-pay | Admitting: Vascular Surgery

## 2012-03-29 ENCOUNTER — Ambulatory Visit (INDEPENDENT_AMBULATORY_CARE_PROVIDER_SITE_OTHER): Payer: Medicare Other | Admitting: Vascular Surgery

## 2012-03-29 VITALS — BP 182/75 | HR 91 | Resp 18 | Ht 64.0 in | Wt 157.0 lb

## 2012-03-29 DIAGNOSIS — T82598A Other mechanical complication of other cardiac and vascular devices and implants, initial encounter: Secondary | ICD-10-CM

## 2012-03-29 DIAGNOSIS — N186 End stage renal disease: Secondary | ICD-10-CM | POA: Insufficient documentation

## 2012-03-29 NOTE — Progress Notes (Signed)
Right Forearm loop graft duplex performed @ [facility] 03/29/2012

## 2012-03-29 NOTE — Progress Notes (Signed)
[facility] [place]  Patient name: Julia Love MRN: [id number] DOB: Mar 04, 1929 Sex: female  REASON FOR VISIT: aneurysm of right forearm AV graft. Referred by Dr. Casimiro Needle.  HPI: Julia Love is a 78 y.o. female who has a right forearm AV graft. She developed a small swelling over the lateral aspect of her graft it sounds like a small blister. The swelling spontaneously resolved essentially and she is left with a small 2 mm eschar over her graft. She was sent for evaluation. She denies any history of fever or chills. Her graft has been functioning well.   REVIEW OF SYSTEMS: Arly.Keller ] denotes positive finding; [  ] denotes negative finding  CARDIOVASCULAR:  [ ]  chest pain   [ ]  dyspnea on exertion    CONSTITUTIONAL:  [ ]  fever   [ ]  chills  PHYSICAL EXAM: Filed Vitals:   03/29/12 1452  BP: 182/75  Pulse: 91  Resp: 18  Height: 5\' 4"  (1.626 m)  Weight: 157 lb (71.215 kg)   Body mass index is 26.95 kg/(m^2). GENERAL: The patient is a well-nourished female, in no acute distress. The vital signs are documented above. CARDIOVASCULAR: There is a regular rate and rhythm  PULMONARY: There is good air exchange bilaterally without wheezing or rales. Her right forearm graft has an excellent bruit and thrill. The central portion along the lateral aspect of the graft is a very small 1-2 mm eschar with no surrounding erythema and no drainage.  MEDICAL ISSUES: I think that this small wound should heal on its own. I have instructed her to keep this covered with bacitracin and a Band-Aid in August we did not allow this segment of the graft to be cannulated. Her skin is very thin I think if this were enlarge she would require bypassing around this segment as I do not think a local approach would be possible given her thin skin. She will call if this wound does not heal.  Malachi Suderman S [facility] [place] Beeper: [phone]

## 2012-04-06 NOTE — Procedures (Unsigned)
VASCULAR LAB EXAM  INDICATION:  Complications, arteriovenous fistula.  HISTORY: Diabetes: Cardiac: Hypertension:  EXAM:  Right forearm loop graft duplex.  IMPRESSION: 1. Right arterial inflow appears patent. 2. There are 2 areas with elevated velocities suggesting stenosis with     nonoccluding thrombus evident involving the mid/distal graft and     graft venous anastomosis. 3. Venous outflow appears patent and without hemodynamic changes     evident.  ___________________________________________ Di Kindle. Edilia Bo, M.D.  SH/MEDQ  D:  03/29/2012  T:  03/29/2012  Job:  [id number]

## 2012-05-04 ENCOUNTER — Other Ambulatory Visit: Payer: Self-pay | Admitting: *Deleted

## 2012-05-05 ENCOUNTER — Encounter (HOSPITAL_COMMUNITY): Payer: Self-pay | Admitting: Pharmacy Technician

## 2012-05-07 MED ORDER — SODIUM CHLORIDE 0.9 % IJ SOLN: 3.0000 mL | INTRAMUSCULAR | Status: DC | PRN

## 2012-05-08 ENCOUNTER — Encounter (HOSPITAL_COMMUNITY)
Admission: RE | Disposition: A | Discharge status: Home / Self Care | Payer: Self-pay | Source: Ambulatory Visit | Attending: Vascular Surgery

## 2012-05-08 ENCOUNTER — Ambulatory Visit (HOSPITAL_COMMUNITY)
Admission: RE | Admit: 2012-05-08 | Discharge: 2012-05-08 | Disposition: A | Discharge status: Home / Self Care | Payer: Medicare Other | Source: Ambulatory Visit | Attending: Vascular Surgery | Admitting: Vascular Surgery

## 2012-05-08 DIAGNOSIS — Y832 Surgical operation with anastomosis, bypass or graft as the cause of abnormal reaction of the patient, or of later complication, without mention of misadventure at the time of the procedure: Secondary | ICD-10-CM | POA: Insufficient documentation

## 2012-05-08 DIAGNOSIS — T82898A Other specified complication of vascular prosthetic devices, implants and grafts, initial encounter: Secondary | ICD-10-CM | POA: Insufficient documentation

## 2012-05-08 DIAGNOSIS — Z992 Dependence on renal dialysis: Secondary | ICD-10-CM | POA: Insufficient documentation

## 2012-05-08 DIAGNOSIS — K219 Gastro-esophageal reflux disease without esophagitis: Secondary | ICD-10-CM | POA: Insufficient documentation

## 2012-05-08 DIAGNOSIS — Z905 Acquired absence of kidney: Secondary | ICD-10-CM | POA: Insufficient documentation

## 2012-05-08 DIAGNOSIS — N186 End stage renal disease: Secondary | ICD-10-CM | POA: Insufficient documentation

## 2012-05-08 DIAGNOSIS — I120 Hypertensive chronic kidney disease with stage 5 chronic kidney disease or end stage renal disease: Secondary | ICD-10-CM | POA: Insufficient documentation

## 2012-05-08 HISTORY — PX: SHUNTOGRAM: SHX5491

## 2012-05-08 LAB — POCT I-STAT, CHEM 8
BUN: 39 mg/dL — ABNORMAL HIGH (ref 6–23)
Chloride: 96 mEq/L (ref 96–112)
Creatinine, Ser: 7.1 mg/dL — ABNORMAL HIGH (ref 0.50–1.10)
Glucose, Bld: 83 mg/dL (ref 70–99)
Hemoglobin: 10.9 g/dL — ABNORMAL LOW (ref 12.0–15.0)
Potassium: 4.2 mEq/L (ref 3.5–5.1)

## 2012-05-08 LAB — GLUCOSE, CAPILLARY: Glucose-Capillary: 86 mg/dL (ref 70–99)

## 2012-05-08 SURGERY — ASSESSMENT, SHUNT FUNCTION, WITH CONTRAST RADIOGRAPHIC STUDY
Anesthesia: LOCAL

## 2012-05-08 MED ORDER — HEPARIN (PORCINE) IN NACL 2-0.9 UNIT/ML-% IJ SOLN
INTRAMUSCULAR | Status: AC
  Filled 2012-05-08: qty 1000

## 2012-05-08 MED ORDER — FENTANYL CITRATE 0.05 MG/ML IJ SOLN
INTRAMUSCULAR | Status: AC
  Filled 2012-05-08: qty 2

## 2012-05-08 MED ORDER — ACETAMINOPHEN 325 MG PO TABS
650.0000 mg | ORAL_TABLET | Freq: Once | ORAL | Status: AC
  Administered 2012-05-08: 650 mg via ORAL

## 2012-05-08 MED ORDER — ACETAMINOPHEN 325 MG PO TABS
ORAL_TABLET | ORAL | Status: AC
  Filled 2012-05-08: qty 2

## 2012-05-08 MED ORDER — MIDAZOLAM HCL 2 MG/2ML IJ SOLN
INTRAMUSCULAR | Status: AC
  Filled 2012-05-08: qty 2

## 2012-05-08 MED ORDER — LIDOCAINE HCL (PF) 1 % IJ SOLN
INTRAMUSCULAR | Status: AC
  Filled 2012-05-08: qty 30

## 2012-05-08 NOTE — H&P (Signed)
[facility] [place]  Patient name: Julia Love MRN: [id number] DOB: 07/01/1929 Sex: female  REASON FOR CONSULT: Poor flow in right forearm AVG  HPI: Julia Love is a 78 y.o. female with poor clearance in her right forearm AVG. I was asked to perform a fistulogram in order to further evaluate.   Past Medical History  Diagnosis Date  . S/P tonsillectomy     1945  . History of nephrectomy     (right) for a non-functioning kidney in 1949  . S/P complete hysterectomy     1990  . Hypertension     for several years  . Fistula     rt arm for dialysis which is not in use at present  . Hemodialysis patient     since November 2005  . Gastric ulcer     back in 1989  . Parathyroid disorder     surgery in August 2011  . Rectocele     repair several years ago  . GERD (gastroesophageal reflux disease)   . S/P laparoscopic cholecystectomy   . Cancer     Colon cancer  . ESRD (end stage renal disease) on dialysis     Family History  Problem Relation Age of Onset  . Hypertension Mother   . Stroke Mother   . Hypertension Father   . Stroke Father   . Hypertension Daughter   . Hypertension Daughter   . Healthy Son   . Colon cancer Neg Hx     SOCIAL HISTORY: History  Substance Use Topics  . Smoking status: Former Smoker -- 1.0 packs/day for 15 years    Types: Cigarettes    Quit date: 07/11/1986  . Smokeless tobacco: Never Used   Comment: Patient smoked 1 pack a day  . Alcohol Use: No    Allergies  Allergen Reactions  . Vicodin (Hydrocodone-Acetaminophen) Other (See Comments)    Makes sleep for long periods of time    Current Facility-Administered Medications  Medication Dose Route Frequency Provider Last Rate Last Dose  . sodium chloride 0.9 % injection 3 mL  3 mL Intravenous PRN Chuck Hint, MD        REVIEW OF SYSTEMS: Arly.Keller ] denotes positive finding; [  ] denotes negative finding CARDIOVASCULAR:  [ ]  chest pain   [ ]  chest  pressure   [ ]  palpitations   [ ]  orthopnea   [ ]  dyspnea on exertion   [ ]  claudication   [ ]  rest pain   [ ]  DVT   [ ]  phlebitis PULMONARY:   [ ]  productive cough   [ ]  asthma   [ ]  wheezing NEUROLOGIC:   [ ]  weakness  [ ]  paresthesias  [ ]  aphasia  [ ]  amaurosis  [ ]  dizziness HEMATOLOGIC:   [ ]  bleeding problems   [ ]  clotting disorders MUSCULOSKELETAL:  [ ]  joint pain   [ ]  joint swelling [ ]  leg swelling GASTROINTESTINAL: [ ]   blood in stool  [ ]   hematemesis GENITOURINARY:  [ ]   dysuria  [ ]   hematuria PSYCHIATRIC:  [ ]  history of major depression INTEGUMENTARY:  [ ]  rashes  [ ]  ulcers CONSTITUTIONAL:  [ ]  fever   [ ]  chills  PHYSICAL EXAM: Filed Vitals:   05/08/12 0904  BP: 169/76  Pulse: 77  Temp: 97.9 F (36.6 C)  TempSrc: Oral  Resp: 20  Height: 5\' 4"  (1.626 m)  Weight: 158 lb (71.668 kg)  SpO2: 95%   Body mass index is 27.12 kg/(m^2). GENERAL: The patient is a well-nourished female, in no acute distress. The vital signs are documented above. CARDIOVASCULAR: There is a regular rate and rhythm without significant murmur appreciated.  PULMONARY: There is good air exchange bilaterally without wheezing or rales. ABDOMEN: Soft and non-tender with normal pitched bowel sounds.  MUSCULOSKELETAL: There are no major deformities or cyanosis. Good thrill in right forearm AVG.  DATA:  Lab Results  Component Value Date   WBC 6.4 12/02/2011   HGB 10.9* 05/08/2012   HCT 32.0* 05/08/2012   MCV 98.1 12/02/2011   PLT 133* 12/02/2011   Lab Results  Component Value Date   NA 140 05/08/2012   K 4.2 05/08/2012   CL 96 05/08/2012   CO2 30 12/02/2011   Lab Results  Component Value Date   CREATININE 7.10* 05/08/2012   Lab Results  Component Value Date   INR 0.96 12/02/2011   No results found for this basename: HGBA1C   CBG (last 3)   Basename 05/08/12 0910  GLUCAP 86   MEDICAL ISSUES: Plan fistulogram of right forearm AVG. Procedure and risks discussed with  patient.  Delorean Knutzen S [facility] [place] Beeper: [phone]

## 2012-05-08 NOTE — Op Note (Signed)
PATIENT: Julia Love   MRN: [id number] DOB: [birth year]    DATE OF PROCEDURE: 05/08/2012  INDICATIONS: Julia Love is a 78 y.o. female who has reportedly had poor flow in her right forearm AV graft. I was asked to perform a fistulogram.  PROCEDURE: ultrasound-guided access to right forearm AV graft with fistulogram of right forearm AV graft  SURGEON: Di Kindle. Edilia Bo, MD, FACS  ANESTHESIA: local with sedation   EBL: minimal  TECHNIQUE: The patient was brought to the peripheral vascular lab and received half a milligram of Versed and 25 mcg of fentanyl. The right arm was prepped and draped in usual sterile fashion. After the skin was anesthetized with 1% lidocaine, and under ultrasound guidance, the arterial limb and the graft was cannulated with a micropuncture needle and a micropuncture sheath was introduced over a wire fistulogram was obtained of the venous limb of the graft and upper arm veins. In addition central venogram was obtained. Next the graft was compressed to allow reflux into the arterial limb of the graft and evaluate the arterial anastomosis.  FINDINGS: there was some mild diffuse disease along the venous limb of the graft but no focal stenosis. There was one small aneurysmal segment along the arterial limb of the graft but again no stenosis of the left side. There was no outflow vein stenosis and the brachial vein subclavian vein and axillary veins were all widely patent.  Waverly Ferrari, MD, FACS [facility]  DATE OF DICTATION:   05/08/2012

## 2012-06-12 ENCOUNTER — Other Ambulatory Visit (HOSPITAL_COMMUNITY): Payer: Self-pay | Admitting: Nephrology

## 2012-06-12 DIAGNOSIS — Z139 Encounter for screening, unspecified: Secondary | ICD-10-CM

## 2012-06-14 ENCOUNTER — Encounter (INDEPENDENT_AMBULATORY_CARE_PROVIDER_SITE_OTHER): Payer: Self-pay | Admitting: *Deleted

## 2012-06-16 ENCOUNTER — Ambulatory Visit (HOSPITAL_COMMUNITY)
Admission: RE | Admit: 2012-06-16 | Discharge: 2012-06-16 | Disposition: A | Discharge status: Home / Self Care | Payer: Medicare Other | Source: Ambulatory Visit | Attending: Nephrology | Admitting: Nephrology

## 2012-06-16 DIAGNOSIS — Z139 Encounter for screening, unspecified: Secondary | ICD-10-CM

## 2012-06-16 DIAGNOSIS — Z1231 Encounter for screening mammogram for malignant neoplasm of breast: Secondary | ICD-10-CM | POA: Insufficient documentation

## 2012-06-20 ENCOUNTER — Other Ambulatory Visit (INDEPENDENT_AMBULATORY_CARE_PROVIDER_SITE_OTHER): Payer: Self-pay | Admitting: Internal Medicine

## 2012-06-20 ENCOUNTER — Telehealth (INDEPENDENT_AMBULATORY_CARE_PROVIDER_SITE_OTHER): Payer: Self-pay | Admitting: *Deleted

## 2012-06-20 DIAGNOSIS — K769 Liver disease, unspecified: Secondary | ICD-10-CM

## 2012-06-20 NOTE — Telephone Encounter (Signed)
Patient sch'd for CT on 06/28/12, will need order for creatinine

## 2012-06-21 ENCOUNTER — Telehealth (INDEPENDENT_AMBULATORY_CARE_PROVIDER_SITE_OTHER): Payer: Self-pay | Admitting: *Deleted

## 2012-06-21 DIAGNOSIS — Z0189 Encounter for other specified special examinations: Secondary | ICD-10-CM

## 2012-06-21 NOTE — Telephone Encounter (Signed)
Lab order noted. 

## 2012-06-21 NOTE — Telephone Encounter (Signed)
The patient is having a diagnostic in 06/28/12

## 2012-06-26 ENCOUNTER — Telehealth (INDEPENDENT_AMBULATORY_CARE_PROVIDER_SITE_OTHER): Payer: Self-pay | Admitting: *Deleted

## 2012-06-26 NOTE — Telephone Encounter (Signed)
We rec'd the panic results for the patient's Creatitine 7.18. Dr.[name] was made aware, the patient is on Dialysis.

## 2012-06-28 ENCOUNTER — Ambulatory Visit (HOSPITAL_COMMUNITY)
Admission: RE | Admit: 2012-06-28 | Discharge: 2012-06-28 | Disposition: A | Discharge status: Home / Self Care | Payer: Medicare Other | Source: Ambulatory Visit | Attending: Internal Medicine | Admitting: Internal Medicine

## 2012-06-28 DIAGNOSIS — K769 Liver disease, unspecified: Secondary | ICD-10-CM

## 2012-06-28 DIAGNOSIS — Z85038 Personal history of other malignant neoplasm of large intestine: Secondary | ICD-10-CM | POA: Insufficient documentation

## 2012-06-28 DIAGNOSIS — K7689 Other specified diseases of liver: Secondary | ICD-10-CM | POA: Insufficient documentation

## 2012-06-28 MED ORDER — IOHEXOL 300 MG/ML  SOLN
100.0000 mL | Freq: Once | INTRAMUSCULAR | Status: AC | PRN
  Administered 2012-06-28: 100 mL via INTRAVENOUS

## 2012-06-28 NOTE — Progress Notes (Signed)
Iv to be d/c per radiology when CT scan done.

## 2012-06-30 ENCOUNTER — Telehealth (INDEPENDENT_AMBULATORY_CARE_PROVIDER_SITE_OTHER): Payer: Self-pay | Admitting: *Deleted

## 2012-06-30 DIAGNOSIS — K7460 Unspecified cirrhosis of liver: Secondary | ICD-10-CM

## 2012-06-30 DIAGNOSIS — R772 Abnormality of alphafetoprotein: Secondary | ICD-10-CM

## 2012-06-30 DIAGNOSIS — K769 Liver disease, unspecified: Secondary | ICD-10-CM

## 2012-06-30 NOTE — Telephone Encounter (Signed)
Per Dr.[name] the patient will need to have the following labs CEA,AFP, will need to fax this request to her Dialysis Clinic

## 2012-07-03 NOTE — Progress Notes (Signed)
Apt has been scheduled for 01/01/13 at 10:30 am with Dr. Karilyn Cota.

## 2012-07-26 ENCOUNTER — Telehealth (INDEPENDENT_AMBULATORY_CARE_PROVIDER_SITE_OTHER): Payer: Self-pay | Admitting: *Deleted

## 2012-07-26 NOTE — Telephone Encounter (Signed)
Earley Abide, patient's daughter, has not heard from the blood test done at the Dialysis Clinic   in 06/2012. Hilda's return phone number is [phone] or [phone].

## 2012-07-27 NOTE — Telephone Encounter (Signed)
The order was faxed to the Dialysis Center on 06/30/12 for CEA, AFP to be drawn. I called the Center to follow up as I reviewed chart and there are no labs present I spoke with Toniann Fail, she acknowledges that she has the order sent in October She is saying that the wrong tubes were used for those tests and ask which ones are they I called the [place] lab and spoke with Sue Lush , both test require a SST/ Tiger Top Tube Toniann Fail was made aware and she stated that on Saturday 07/29/12 when Ms. Forker returns They will drawn them and take them to the hospital Toniann Fail patient's daughter called and made aware\ Forwarded to Dr.[name] as Lorain Childes

## 2012-07-29 LAB — CEA: CEA: 4 ng/mL (ref 0.0–5.0)

## 2012-07-29 LAB — AFP TUMOR MARKER: AFP-Tumor Marker: 4.8 ng/mL (ref 0.0–8.0)

## 2012-07-30 NOTE — Telephone Encounter (Signed)
Will call patient's daughter when CEA results available

## 2012-08-07 NOTE — Telephone Encounter (Signed)
Apt has been scheduled for 01/02/12 with Dr. Karilyn Cota.

## 2012-10-02 ENCOUNTER — Encounter: Payer: Self-pay | Admitting: Cardiology

## 2012-10-02 ENCOUNTER — Ambulatory Visit (INDEPENDENT_AMBULATORY_CARE_PROVIDER_SITE_OTHER): Payer: Medicare Other | Admitting: Cardiology

## 2012-10-02 VITALS — BP 122/60 | HR 82 | Ht 64.0 in | Wt 159.0 lb

## 2012-10-02 DIAGNOSIS — R0989 Other specified symptoms and signs involving the circulatory and respiratory systems: Secondary | ICD-10-CM

## 2012-10-02 DIAGNOSIS — I10 Essential (primary) hypertension: Secondary | ICD-10-CM

## 2012-10-02 DIAGNOSIS — R0609 Other forms of dyspnea: Secondary | ICD-10-CM | POA: Insufficient documentation

## 2012-10-02 NOTE — Progress Notes (Signed)
HPI [name] comes in for valuation management of her dyspnea on exertion. A sore in 2002 which time a stress Myoview was unremarkable. Her ejection fraction was low normal at around 45% but it was a nuclear study. She's done well  continues to tolerate hemodialysis well. She denies any chest pain or increased dyspnea on exertion. She fell about 4 months ago but missed a step. She denies any syncope.  Past Medical History  Diagnosis Date  . S/P tonsillectomy     1945  . History of nephrectomy     (right) for a non-functioning kidney in 1949  . S/P complete hysterectomy     1990  . Hypertension     for several years  . Fistula     rt arm for dialysis which is not in use at present  . Hemodialysis patient     since November 2005  . Gastric ulcer     back in 1989  . Parathyroid disorder     surgery in August 2011  . Rectocele     repair several years ago  . GERD (gastroesophageal reflux disease)   . S/P laparoscopic cholecystectomy   . Cancer     Colon cancer  . ESRD (end stage renal disease) on dialysis     Current Outpatient Prescriptions  Medication Sig Dispense Refill  . allopurinol (ZYLOPRIM) 100 MG tablet Take 100 mg by mouth daily.       Marland Kitchen amLODipine (NORVASC) 5 MG tablet Take 5 mg by mouth at bedtime.       Marland Kitchen aspirin EC 81 MG tablet Take 81 mg by mouth daily.      . cinacalcet (SENSIPAR) 60 MG tablet Take 60 mg by mouth at bedtime.      Marland Kitchen FIBER FORMULA PO Take by mouth. Patient states that she takes 2 tablespoons up to 2-3 times daily      . lanthanum (FOSRENOL) 1000 MG chewable tablet Chew 1,000 mg by mouth 2 (two) times daily with a meal. With snacks      . lisinopril (PRINIVIL,ZESTRIL) 20 MG tablet Take 40 mg by mouth at bedtime.       . Multiple Vitamins-Minerals (DIALYVITE 800/ULTRA D PO) Take 1 tablet by mouth daily.       Marland Kitchen omeprazole (PRILOSEC) 20 MG capsule Take 20 mg by mouth daily.         Allergies  Allergen Reactions  . Vicodin  (Hydrocodone-Acetaminophen) Other (See Comments)    Makes sleep for long periods of time    Family History  Problem Relation Age of Onset  . Hypertension Mother   . Stroke Mother   . Hypertension Father   . Stroke Father   . Hypertension Daughter   . Hypertension Daughter   . Healthy Son   . Colon cancer Neg Hx     History   Social History  . Marital Status: Widowed    Spouse Name: N/A    Number of Children: N/A  . Years of Education: N/A   Occupational History  . Not on file.   Social History Main Topics  . Smoking status: Former Smoker -- 1.0 packs/day for 15 years    Types: Cigarettes    Quit date: 07/11/1986  . Smokeless tobacco: Never Used     Comment: Patient smoked 1 pack a day  . Alcohol Use: No  . Drug Use: No  . Sexually Active: Not on file   Other Topics Concern  . Not on file  Social History Narrative  . No narrative on file    ROS ALL NEGATIVE EXCEPT THOSE NOTED IN HPI  PE  General Appearance: well developed, well nourished in no acute distress, frail HEENT: symmetrical face, PERRLA, good dentition  Neck: no JVD, thyromegaly, or adenopathy, trachea midline Chest: symmetric without deformity Cardiac: PMI non-displaced, RRR, normal S1, S2, no gallop or murmur Lung: clear to ausculation and percussion Vascular: all pulses full without bruits  Abdominal: nondistended, nontender, good bowel sounds, no HSM, no bruits Extremities: no cyanosis, clubbing or edema, no sign of DVT, no varicosities  Skin: normal color, no rashes Neuro: alert and oriented x 3, non-focal Pysch: normal affect  EKG Normal sinus rhythm, normal EKG BMET    Component Value Date/Time   NA 140 05/08/2012 0911   K 4.2 05/08/2012 0911   CL 96 05/08/2012 0911   CO2 30 12/02/2011 1515   GLUCOSE 83 05/08/2012 0911   BUN 39* 05/08/2012 0911   CREATININE 7.18* 06/21/2012 1204   CREATININE 7.10* 05/08/2012 0911   CALCIUM 10.0 12/02/2011 1515   GFRNONAA 5* 05/10/2009 0330   GFRAA   Value: 6        The eGFR has been calculated using the MDRD equation. This calculation has not been validated in all clinical situations. eGFR's persistently <60 mL/min signify possible Chronic Kidney Disease.* 05/10/2009 0330    Lipid Panel  No results found for this basename: chol, trig, hdl, cholhdl, vldl, ldlcalc    CBC    Component Value Date/Time   WBC 6.4 12/02/2011 1515   RBC 3.71* 12/02/2011 1515   HGB 10.9* 05/08/2012 0911   HCT 32.0* 05/08/2012 0911   PLT 133* 12/02/2011 1515   MCV 98.1 12/02/2011 1515   MCH 28.8 12/02/2011 1515   MCHC 29.4* 12/02/2011 1515   RDW 14.5 12/02/2011 1515   LYMPHSABS 1.8 05/02/2009 0929   MONOABS 0.6 05/02/2009 0929   EOSABS 0.2 05/02/2009 0929   BASOSABS 0.0 05/02/2009 0929

## 2012-10-02 NOTE — Assessment & Plan Note (Signed)
No significant change since last visit and 2010. Stress Myoview unremarkable at that time. She is tolerating dialysis well. No current recommendations. Return when necessary.

## 2012-10-02 NOTE — Patient Instructions (Addendum)
Your physician recommends that you schedule a follow-up appointment in: PRN 

## 2012-11-07 ENCOUNTER — Other Ambulatory Visit (HOSPITAL_COMMUNITY): Payer: Self-pay | Admitting: Nephrology

## 2012-11-07 ENCOUNTER — Encounter (HOSPITAL_COMMUNITY): Payer: Self-pay

## 2012-11-07 ENCOUNTER — Ambulatory Visit (HOSPITAL_COMMUNITY)
Admission: RE | Admit: 2012-11-07 | Discharge: 2012-11-07 | Disposition: A | Discharge status: Home / Self Care | Payer: Medicare Other | Source: Ambulatory Visit | Attending: Nephrology | Admitting: Nephrology

## 2012-11-07 DIAGNOSIS — I82609 Acute embolism and thrombosis of unspecified veins of unspecified upper extremity: Secondary | ICD-10-CM | POA: Insufficient documentation

## 2012-11-07 DIAGNOSIS — I120 Hypertensive chronic kidney disease with stage 5 chronic kidney disease or end stage renal disease: Secondary | ICD-10-CM | POA: Insufficient documentation

## 2012-11-07 DIAGNOSIS — T82898A Other specified complication of vascular prosthetic devices, implants and grafts, initial encounter: Secondary | ICD-10-CM | POA: Insufficient documentation

## 2012-11-07 DIAGNOSIS — Z85038 Personal history of other malignant neoplasm of large intestine: Secondary | ICD-10-CM | POA: Insufficient documentation

## 2012-11-07 DIAGNOSIS — N186 End stage renal disease: Secondary | ICD-10-CM

## 2012-11-07 DIAGNOSIS — Y832 Surgical operation with anastomosis, bypass or graft as the cause of abnormal reaction of the patient, or of later complication, without mention of misadventure at the time of the procedure: Secondary | ICD-10-CM | POA: Insufficient documentation

## 2012-11-07 DIAGNOSIS — Z992 Dependence on renal dialysis: Secondary | ICD-10-CM | POA: Insufficient documentation

## 2012-11-07 DIAGNOSIS — I871 Compression of vein: Secondary | ICD-10-CM | POA: Insufficient documentation

## 2012-11-07 LAB — POCT I-STAT 4, (NA,K, GLUC, HGB,HCT)
Glucose, Bld: 95 mg/dL (ref 70–99)
HCT: 31 % — ABNORMAL LOW (ref 36.0–46.0)
Potassium: 5.2 mEq/L — ABNORMAL HIGH (ref 3.5–5.1)
Sodium: 136 mEq/L (ref 135–145)

## 2012-11-07 MED ORDER — ALTEPLASE 100 MG IV SOLR
4.0000 mg | Freq: Once | INTRAVENOUS | Status: AC
  Administered 2012-11-07: 4 mg
  Filled 2012-11-07: qty 4

## 2012-11-07 MED ORDER — HEPARIN SODIUM (PORCINE) 1000 UNIT/ML IJ SOLN
INTRAMUSCULAR | Status: AC
  Filled 2012-11-07: qty 1

## 2012-11-07 MED ORDER — FENTANYL CITRATE 0.05 MG/ML IJ SOLN
INTRAMUSCULAR | Status: AC
  Filled 2012-11-07: qty 4

## 2012-11-07 MED ORDER — MIDAZOLAM HCL 2 MG/2ML IJ SOLN
INTRAMUSCULAR | Status: DC | PRN
  Administered 2012-11-07 (×4): 1 mg via INTRAVENOUS

## 2012-11-07 MED ORDER — SODIUM POLYSTYRENE SULFONATE 15 GM/60ML PO SUSP
15.0000 g | Freq: Once | ORAL | Status: DC
  Filled 2012-11-07: qty 60

## 2012-11-07 MED ORDER — FENTANYL CITRATE 0.05 MG/ML IJ SOLN
INTRAMUSCULAR | Status: DC | PRN
  Administered 2012-11-07 (×2): 50 ug via INTRAVENOUS
  Administered 2012-11-07 (×2): 25 ug via INTRAVENOUS

## 2012-11-07 MED ORDER — HEPARIN SODIUM (PORCINE) 1000 UNIT/ML IJ SOLN
INTRAMUSCULAR | Status: DC | PRN
  Administered 2012-11-07 (×2): 3000 [IU] via INTRAVENOUS

## 2012-11-07 MED ORDER — MIDAZOLAM HCL 2 MG/2ML IJ SOLN
INTRAMUSCULAR | Status: AC
  Filled 2012-11-07: qty 4

## 2012-11-07 MED ORDER — IOHEXOL 300 MG/ML  SOLN
100.0000 mL | Freq: Once | INTRAMUSCULAR | Status: AC | PRN
  Administered 2012-11-07: 60 mL via INTRAVENOUS

## 2012-11-07 NOTE — H&P (Signed)
Julia Love is an 79 y.o. female.   Chief Complaint: clotted Right forearm dialysis graft Last use 2/21: successful Last intervention: pta 03/2011 Last declot: 12/2010 RN unable to feel or hear graft today Scheduled today for thrombolysis and possible angioplasty/stent placement Possible dialysis catheter placement if needed HPI: ESRD; HTN; Colon Ca  Past Medical History  Diagnosis Date  . S/P tonsillectomy     1945  . History of nephrectomy     (right) for a non-functioning kidney in 1949  . S/P complete hysterectomy     1990  . Hypertension     for several years  . Fistula     rt arm for dialysis which is not in use at present  . Hemodialysis patient     since November 2005  . Gastric ulcer     back in 1989  . Parathyroid disorder     surgery in August 2011  . Rectocele     repair several years ago  . GERD (gastroesophageal reflux disease)   . S/P laparoscopic cholecystectomy   . Cancer     Colon cancer  . ESRD (end stage renal disease) on dialysis     Past Surgical History  Procedure Laterality Date  . Abdominal hysterectomy  1990  . Colonoscopy  10/20/2011    Procedure: COLONOSCOPY;  Surgeon: Malissa Hippo, MD;  Location: [facility];  Service: Endoscopy;  Laterality: N/A;  100  . Esophagogastroduodenoscopy  10/20/2011    Procedure: ESOPHAGOGASTRODUODENOSCOPY (EGD);  Surgeon: Malissa Hippo, MD;  Location: [facility];  Service: Endoscopy;  Laterality: N/A;  Possible EGD  . Laparoscopic nephrectomy  1949  . Eye surgery  2009, 2005    bilateral  . Dg av dialysis graft declot or    . Cholecystectomy  08  . Parathyroidectomy  07/07/09  . Rectocele repair    . Tonsillectomy    . Colonoscopy  11/05/2011    Procedure: COLONOSCOPY;  Surgeon: Malissa Hippo, MD;  Location: [facility];  Service: Endoscopy;  Laterality: N/A;  730    Family History  Problem Relation Age of Onset  . Hypertension Mother   . Stroke Mother   . Hypertension Father   . Stroke  Father   . Hypertension Daughter   . Hypertension Daughter   . Healthy Son   . Colon cancer Neg Hx    Social History:  reports that she quit smoking about 26 years ago. Her smoking use included Cigarettes. She has a 15 pack-year smoking history. She has never used smokeless tobacco. She reports that she does not drink alcohol or use illicit drugs.  Allergies:  Allergies  Allergen Reactions  . Vicodin (Hydrocodone-Acetaminophen) Other (See Comments)    Makes sleep for long periods of time     (Not in a hospital admission)  No results found for this or any previous visit (from the past 48 hour(s)). No results found.  Review of Systems  Constitutional: Negative for fever.  Respiratory: Negative for cough.   Cardiovascular: Negative for chest pain.  Gastrointestinal: Negative for nausea, vomiting and abdominal pain.  Neurological: Positive for weakness. Negative for headaches.    There were no vitals taken for this visit. Physical Exam  Constitutional: She is oriented to person, place, and time. She appears well-developed and well-nourished.  Cardiovascular: Normal rate and regular rhythm.   Murmur heard. Respiratory: Effort normal and breath sounds normal. She has no wheezes.  GI: Soft. Bowel sounds are normal. There  is no tenderness.  Musculoskeletal: Normal range of motion.  Neurological: She is alert and oriented to person, place, and time.  Skin: Skin is warm and dry.  Psychiatric: She has a normal mood and affect. Her behavior is normal. Judgment and thought content normal.     Assessment/Plan Rt forearm dialysis graft clotted Scheduled for thrombolysis and poss pta/stent placement Poss dialysis catheter if needed Pt aware of procedure benefits and risks and agreeable to proceed Consent signed and in chart  Dionel Archey A 11/07/2012, 11:54 AM

## 2012-11-07 NOTE — Procedures (Signed)
Technically successful declot of right forearm dialysis graft.  No immediate post procedural complications. 

## 2012-11-07 NOTE — ED Notes (Signed)
Patient denies pain and is resting comfortably.  

## 2012-11-15 ENCOUNTER — Other Ambulatory Visit (HOSPITAL_COMMUNITY): Payer: Self-pay | Admitting: Nephrology

## 2012-11-15 DIAGNOSIS — N186 End stage renal disease: Secondary | ICD-10-CM

## 2012-11-17 ENCOUNTER — Other Ambulatory Visit (HOSPITAL_COMMUNITY): Payer: Self-pay | Admitting: Nephrology

## 2012-11-17 ENCOUNTER — Encounter (HOSPITAL_COMMUNITY): Payer: Self-pay

## 2012-11-17 ENCOUNTER — Ambulatory Visit (HOSPITAL_COMMUNITY)
Admission: RE | Admit: 2012-11-17 | Discharge: 2012-11-17 | Disposition: A | Discharge status: Home / Self Care | Payer: Medicare Other | Source: Ambulatory Visit | Attending: Nephrology | Admitting: Nephrology

## 2012-11-17 DIAGNOSIS — I871 Compression of vein: Secondary | ICD-10-CM | POA: Insufficient documentation

## 2012-11-17 DIAGNOSIS — T82898A Other specified complication of vascular prosthetic devices, implants and grafts, initial encounter: Secondary | ICD-10-CM | POA: Insufficient documentation

## 2012-11-17 DIAGNOSIS — N186 End stage renal disease: Secondary | ICD-10-CM

## 2012-11-17 DIAGNOSIS — Y832 Surgical operation with anastomosis, bypass or graft as the cause of abnormal reaction of the patient, or of later complication, without mention of misadventure at the time of the procedure: Secondary | ICD-10-CM | POA: Insufficient documentation

## 2012-11-17 DIAGNOSIS — I82609 Acute embolism and thrombosis of unspecified veins of unspecified upper extremity: Secondary | ICD-10-CM | POA: Insufficient documentation

## 2012-11-17 DIAGNOSIS — Z992 Dependence on renal dialysis: Secondary | ICD-10-CM | POA: Insufficient documentation

## 2012-11-17 DIAGNOSIS — Z85038 Personal history of other malignant neoplasm of large intestine: Secondary | ICD-10-CM | POA: Insufficient documentation

## 2012-11-17 DIAGNOSIS — I120 Hypertensive chronic kidney disease with stage 5 chronic kidney disease or end stage renal disease: Secondary | ICD-10-CM | POA: Insufficient documentation

## 2012-11-17 LAB — POTASSIUM: Potassium: 4.4 mEq/L (ref 3.5–5.1)

## 2012-11-17 MED ORDER — HEPARIN SODIUM (PORCINE) 1000 UNIT/ML IJ SOLN
INTRAMUSCULAR | Status: DC | PRN
  Administered 2012-11-17: 2000 [IU] via INTRAVENOUS

## 2012-11-17 MED ORDER — CHLOROPROCAINE HCL 1 % IJ SOLN
10.0000 mL | Freq: Once | INTRAMUSCULAR | Status: DC
  Filled 2012-11-17: qty 30

## 2012-11-17 MED ORDER — MIDAZOLAM HCL 2 MG/2ML IJ SOLN
INTRAMUSCULAR | Status: DC | PRN
  Administered 2012-11-17 (×2): 1 mg via INTRAVENOUS

## 2012-11-17 MED ORDER — TETRACAINE HCL 1 % IJ SOLN
10.0000 mg | Freq: Once | INTRAMUSCULAR | Status: DC
  Filled 2012-11-17: qty 2

## 2012-11-17 MED ORDER — FENTANYL CITRATE 0.05 MG/ML IJ SOLN
INTRAMUSCULAR | Status: DC | PRN
  Administered 2012-11-17: 25 ug via INTRAVENOUS
  Administered 2012-11-17: 50 ug via INTRAVENOUS
  Administered 2012-11-17: 25 ug via INTRAVENOUS

## 2012-11-17 MED ORDER — ALTEPLASE 100 MG IV SOLR
4.0000 mg | Freq: Once | INTRAVENOUS | Status: AC
  Administered 2012-11-17: 4 mg
  Filled 2012-11-17: qty 4

## 2012-11-17 MED ORDER — IOHEXOL 300 MG/ML  SOLN
100.0000 mL | Freq: Once | INTRAMUSCULAR | Status: AC | PRN
  Administered 2012-11-17: 50 mL via INTRAVENOUS

## 2012-11-17 MED ORDER — HEPARIN SODIUM (PORCINE) 1000 UNIT/ML IJ SOLN
INTRAMUSCULAR | Status: AC
  Administered 2012-11-17: 3000 [IU]
  Filled 2012-11-17: qty 1

## 2012-11-17 MED ORDER — FENTANYL CITRATE 0.05 MG/ML IJ SOLN
INTRAMUSCULAR | Status: AC
  Filled 2012-11-17: qty 4

## 2012-11-17 MED ORDER — MIDAZOLAM HCL 2 MG/2ML IJ SOLN
INTRAMUSCULAR | Status: AC
  Filled 2012-11-17: qty 4

## 2012-11-17 NOTE — H&P (Signed)
Julia Love is an 79 y.o. female.   Chief Complaint: Clotted graft HPI: 79 y/o F with h/o ESRD on HD, presents today with recurrent thrombosis of right forearm loop graft.  Last declot - 2/25.  Was able to successfully dialyze via graft 3-4 times prior to recurrent thrombosis.  No change in baseline health.  Baseline DOE with activity.  No CP, fever or chills.  Past Medical History  Diagnosis Date  . S/P tonsillectomy     1945  . History of nephrectomy     (right) for a non-functioning kidney in 1949  . S/P complete hysterectomy     1990  . Hypertension     for several years  . Fistula     rt arm for dialysis which is not in use at present  . Hemodialysis patient     since November 2005  . Gastric ulcer     back in 1989  . Parathyroid disorder     surgery in August 2011  . Rectocele     repair several years ago  . GERD (gastroesophageal reflux disease)   . S/P laparoscopic cholecystectomy   . Cancer     Colon cancer  . ESRD (end stage renal disease) on dialysis     Past Surgical History  Procedure Laterality Date  . Abdominal hysterectomy  1990  . Colonoscopy  10/20/2011    Procedure: COLONOSCOPY;  Surgeon: Malissa Hippo, MD;  [facility];  Service: Endoscopy;  Laterality: N/A;  100  . Esophagogastroduodenoscopy  10/20/2011    Procedure: ESOPHAGOGASTRODUODENOSCOPY (EGD);  Surgeon: Malissa Hippo, MD;  [facility];  Service: Endoscopy;  Laterality: N/A;  Possible EGD  . Laparoscopic nephrectomy  1949  . Eye surgery  2009, 2005    bilateral  . Dg av dialysis graft declot or    . Cholecystectomy  08  . Parathyroidectomy  07/07/09  . Rectocele repair    . Tonsillectomy    . Colonoscopy  11/05/2011    Procedure: COLONOSCOPY;  Surgeon: Malissa Hippo, MD;  [facility];  Service: Endoscopy;  Laterality: N/A;  730    Family History  Problem Relation Age of Onset  . Hypertension Mother   . Stroke Mother   . Hypertension Father   .  Stroke Father   . Hypertension Daughter   . Hypertension Daughter   . Healthy Son   . Colon cancer Neg Hx    Social History:  reports that she quit smoking about 26 years ago. Her smoking use included Cigarettes. She has a 15 pack-year smoking history. She has never used smokeless tobacco. She reports that she does not drink alcohol or use illicit drugs.  Allergies:  Allergies  Allergen Reactions  . Vicodin (Hydrocodone-Acetaminophen) Other (See Comments)    Makes sleep for long periods of time     (Not in a hospital admission)  No results found for this or any previous visit (from the past 48 hour(s)). No results found.  Review of Systems  Constitutional: Negative.   Respiratory: Positive for shortness of breath.        With activity   Cardiovascular: Negative.   Psychiatric/Behavioral: Negative.     Blood pressure 123/62, pulse 86, resp. rate 16, SpO2 97.00%. Physical Exam  Constitutional: She is oriented to person, place, and time. She appears well-developed and well-nourished.  Cardiovascular: Normal rate, regular rhythm and normal heart sounds.   Respiratory: Effort normal and breath sounds normal.  Neurological: She is alert and oriented to person, place, and time.  Psychiatric: She has a normal mood and affect.     Assessment/Plan 79 y/o F with ESRD on HD presents with recurrent thrombosis of right forearm dialysis graft following recent declot procedure performed on 2/23.  Informed written consent obtained after a discussion of the benefits and risks (including bleeding/vessel injury and infection) of declot with possible stent and HD catheter placement.    Simonne Come 11/17/2012, 8:09 AM

## 2012-11-17 NOTE — Procedures (Signed)
Technically successful declot of right forearm dialysis graft.  No immediate post procedural complications. 

## 2012-11-23 ENCOUNTER — Ambulatory Visit (HOSPITAL_COMMUNITY): Payer: Medicare Other

## 2012-11-23 ENCOUNTER — Encounter (HOSPITAL_COMMUNITY): Payer: Self-pay | Admitting: Anesthesiology

## 2012-11-23 ENCOUNTER — Ambulatory Visit (HOSPITAL_COMMUNITY)
Admission: RE | Admit: 2012-11-23 | Discharge: 2012-11-23 | Disposition: A | Discharge status: Home / Self Care | Payer: Medicare Other | Source: Ambulatory Visit | Attending: Vascular Surgery | Admitting: Vascular Surgery

## 2012-11-23 ENCOUNTER — Encounter (HOSPITAL_COMMUNITY)
Admission: RE | Disposition: A | Discharge status: Home / Self Care | Payer: Self-pay | Source: Ambulatory Visit | Attending: Vascular Surgery

## 2012-11-23 ENCOUNTER — Encounter (HOSPITAL_COMMUNITY): Payer: Self-pay | Admitting: *Deleted

## 2012-11-23 ENCOUNTER — Ambulatory Visit (HOSPITAL_COMMUNITY): Payer: Medicare Other | Admitting: Anesthesiology

## 2012-11-23 DIAGNOSIS — Z992 Dependence on renal dialysis: Secondary | ICD-10-CM | POA: Insufficient documentation

## 2012-11-23 DIAGNOSIS — T82898A Other specified complication of vascular prosthetic devices, implants and grafts, initial encounter: Secondary | ICD-10-CM

## 2012-11-23 DIAGNOSIS — K219 Gastro-esophageal reflux disease without esophagitis: Secondary | ICD-10-CM | POA: Insufficient documentation

## 2012-11-23 DIAGNOSIS — M1990 Unspecified osteoarthritis, unspecified site: Secondary | ICD-10-CM | POA: Insufficient documentation

## 2012-11-23 DIAGNOSIS — R0602 Shortness of breath: Secondary | ICD-10-CM | POA: Insufficient documentation

## 2012-11-23 DIAGNOSIS — Y832 Surgical operation with anastomosis, bypass or graft as the cause of abnormal reaction of the patient, or of later complication, without mention of misadventure at the time of the procedure: Secondary | ICD-10-CM | POA: Insufficient documentation

## 2012-11-23 DIAGNOSIS — Z79899 Other long term (current) drug therapy: Secondary | ICD-10-CM | POA: Insufficient documentation

## 2012-11-23 DIAGNOSIS — Z8711 Personal history of peptic ulcer disease: Secondary | ICD-10-CM | POA: Insufficient documentation

## 2012-11-23 DIAGNOSIS — Z7982 Long term (current) use of aspirin: Secondary | ICD-10-CM | POA: Insufficient documentation

## 2012-11-23 DIAGNOSIS — M129 Arthropathy, unspecified: Secondary | ICD-10-CM | POA: Insufficient documentation

## 2012-11-23 DIAGNOSIS — I82609 Acute embolism and thrombosis of unspecified veins of unspecified upper extremity: Secondary | ICD-10-CM | POA: Insufficient documentation

## 2012-11-23 DIAGNOSIS — N186 End stage renal disease: Secondary | ICD-10-CM | POA: Insufficient documentation

## 2012-11-23 DIAGNOSIS — I120 Hypertensive chronic kidney disease with stage 5 chronic kidney disease or end stage renal disease: Secondary | ICD-10-CM | POA: Insufficient documentation

## 2012-11-23 HISTORY — PX: THROMBECTOMY AND REVISION OF ARTERIOVENTOUS (AV) GORETEX  GRAFT: SHX6120

## 2012-11-23 LAB — POCT I-STAT 4, (NA,K, GLUC, HGB,HCT)
Glucose, Bld: 132 mg/dL — ABNORMAL HIGH (ref 70–99)
HCT: 29 % — ABNORMAL LOW (ref 36.0–46.0)
Hemoglobin: 9.9 g/dL — ABNORMAL LOW (ref 12.0–15.0)
Potassium: 4.6 mEq/L (ref 3.5–5.1)
Sodium: 139 mEq/L (ref 135–145)

## 2012-11-23 SURGERY — THROMBECTOMY AND REVISION OF ARTERIOVENTOUS (AV) GORETEX  GRAFT
Anesthesia: Monitor Anesthesia Care | Site: Arm Upper | Laterality: Right | Wound class: Clean

## 2012-11-23 MED ORDER — SODIUM CHLORIDE 0.9 % IR SOLN
Status: DC | PRN
  Administered 2012-11-23: 15:00:00

## 2012-11-23 MED ORDER — SODIUM CHLORIDE 0.9 % IV SOLN
INTRAVENOUS | Status: DC
  Administered 2012-11-23: 13:00:00 via INTRAVENOUS

## 2012-11-23 MED ORDER — MUPIROCIN 2 % EX OINT: TOPICAL_OINTMENT | Freq: Two times a day (BID) | CUTANEOUS | Status: DC

## 2012-11-23 MED ORDER — CEFAZOLIN SODIUM-DEXTROSE 2-3 GM-% IV SOLR
INTRAVENOUS | Status: DC | PRN
  Administered 2012-11-23: 2 g via INTRAVENOUS

## 2012-11-23 MED ORDER — BUPIVACAINE HCL 0.5 % IJ SOLN
INTRAMUSCULAR | Status: DC | PRN
  Administered 2012-11-23: 12.5 mL

## 2012-11-23 MED ORDER — LIDOCAINE-EPINEPHRINE (PF) 1 %-1:200000 IJ SOLN
INTRAMUSCULAR | Status: DC | PRN
  Administered 2012-11-23: 12.5 mL

## 2012-11-23 MED ORDER — LIDOCAINE-EPINEPHRINE (PF) 1 %-1:200000 IJ SOLN
INTRAMUSCULAR | Status: AC
  Filled 2012-11-23: qty 10

## 2012-11-23 MED ORDER — HEPARIN SODIUM (PORCINE) 1000 UNIT/ML IJ SOLN
INTRAMUSCULAR | Status: DC | PRN
  Administered 2012-11-23: 5000 [IU] via INTRAVENOUS

## 2012-11-23 MED ORDER — MIDAZOLAM HCL 5 MG/5ML IJ SOLN
INTRAMUSCULAR | Status: DC | PRN
  Administered 2012-11-23 (×2): 1 mg via INTRAVENOUS

## 2012-11-23 MED ORDER — THROMBIN 20000 UNITS EX SOLR
CUTANEOUS | Status: DC | PRN
  Administered 2012-11-23: 16:00:00 via TOPICAL

## 2012-11-23 MED ORDER — CEFAZOLIN SODIUM 1-5 GM-% IV SOLN
INTRAVENOUS | Status: AC
  Filled 2012-11-23: qty 100

## 2012-11-23 MED ORDER — DEXTROSE 5 % IV SOLN
INTRAVENOUS | Status: DC | PRN
  Administered 2012-11-23: 15:00:00 via INTRAVENOUS

## 2012-11-23 MED ORDER — SODIUM CHLORIDE 0.9 % IV SOLN
INTRAVENOUS | Status: DC | PRN
  Administered 2012-11-23: 14:00:00 via INTRAVENOUS

## 2012-11-23 MED ORDER — BUPIVACAINE HCL (PF) 0.5 % IJ SOLN
INTRAMUSCULAR | Status: AC
  Filled 2012-11-23: qty 30

## 2012-11-23 MED ORDER — PROTAMINE SULFATE 10 MG/ML IV SOLN
INTRAVENOUS | Status: DC | PRN
  Administered 2012-11-23: 30 mg via INTRAVENOUS

## 2012-11-23 MED ORDER — THROMBIN 20000 UNITS EX SOLR
CUTANEOUS | Status: AC
  Filled 2012-11-23: qty 20000

## 2012-11-23 MED ORDER — HEPARIN SODIUM (PORCINE) 1000 UNIT/ML IJ SOLN
INTRAMUSCULAR | Status: AC
  Filled 2012-11-23: qty 1

## 2012-11-23 MED ORDER — MUPIROCIN 2 % EX OINT
TOPICAL_OINTMENT | CUTANEOUS | Status: AC
  Filled 2012-11-23: qty 22

## 2012-11-23 MED ORDER — 0.9 % SODIUM CHLORIDE (POUR BTL) OPTIME
TOPICAL | Status: DC | PRN
  Administered 2012-11-23: 1000 mL

## 2012-11-23 MED ORDER — FENTANYL CITRATE 0.05 MG/ML IJ SOLN
INTRAMUSCULAR | Status: DC | PRN
  Administered 2012-11-23 (×4): 25 ug via INTRAVENOUS

## 2012-11-23 MED ORDER — PROPOFOL INFUSION 10 MG/ML OPTIME
INTRAVENOUS | Status: DC | PRN
  Administered 2012-11-23: 50 ug/kg/min via INTRAVENOUS

## 2012-11-23 SURGICAL SUPPLY — 68 items
ADH SKN CLS APL DERMABOND .7 (GAUZE/BANDAGES/DRESSINGS) ×2
BAG DECANTER FOR FLEXI CONT (MISCELLANEOUS) ×1 IMPLANT
CANISTER SUCTION 2500CC (MISCELLANEOUS) ×3 IMPLANT
CATH CANNON HEMO 15F 50CM (CATHETERS) IMPLANT
CATH CANNON HEMO 15FR 19 (HEMODIALYSIS SUPPLIES) IMPLANT
CATH CANNON HEMO 15FR 23CM (HEMODIALYSIS SUPPLIES) IMPLANT
CATH CANNON HEMO 15FR 31CM (HEMODIALYSIS SUPPLIES) IMPLANT
CATH CANNON HEMO 15FR 32 (HEMODIALYSIS SUPPLIES) IMPLANT
CATH CANNON HEMO 15FR 32CM (HEMODIALYSIS SUPPLIES) IMPLANT
CATH EMB 3FR 40CM (CATHETERS) ×2 IMPLANT
CATH EMB 4FR 80CM (CATHETERS) ×3 IMPLANT
CLIP TI MEDIUM 6 (CLIP) ×3 IMPLANT
CLIP TI WIDE RED SMALL 6 (CLIP) ×3 IMPLANT
CLOTH BEACON ORANGE TIMEOUT ST (SAFETY) ×3 IMPLANT
COVER PROBE W GEL 5X96 (DRAPES) ×3 IMPLANT
COVER SURGICAL LIGHT HANDLE (MISCELLANEOUS) ×3 IMPLANT
DERMABOND ADVANCED (GAUZE/BANDAGES/DRESSINGS) ×1
DERMABOND ADVANCED .7 DNX12 (GAUZE/BANDAGES/DRESSINGS) ×2 IMPLANT
DRAPE C-ARM 42X72 X-RAY (DRAPES) ×1 IMPLANT
DRAPE CHEST BREAST 15X10 FENES (DRAPES) ×1 IMPLANT
ELECT REM PT RETURN 9FT ADLT (ELECTROSURGICAL) ×3
ELECTRODE REM PT RTRN 9FT ADLT (ELECTROSURGICAL) ×2 IMPLANT
GAUZE SPONGE 2X2 8PLY STRL LF (GAUZE/BANDAGES/DRESSINGS) ×1 IMPLANT
GAUZE SPONGE 4X4 16PLY XRAY LF (GAUZE/BANDAGES/DRESSINGS) ×1 IMPLANT
GEL ULTRASOUND 20GR AQUASONIC (MISCELLANEOUS) ×2 IMPLANT
GLOVE BIO SURGEON STRL SZ7 (GLOVE) ×3 IMPLANT
GLOVE BIOGEL M STER SZ 6 (GLOVE) ×2 IMPLANT
GLOVE BIOGEL PI IND STRL 6.5 (GLOVE) ×2 IMPLANT
GLOVE BIOGEL PI IND STRL 7.5 (GLOVE) ×3 IMPLANT
GLOVE BIOGEL PI INDICATOR 6.5 (GLOVE) ×2
GLOVE BIOGEL PI INDICATOR 7.5 (GLOVE) ×2
GLOVE SS BIOGEL STRL SZ 7 (GLOVE) ×1 IMPLANT
GLOVE SUPERSENSE BIOGEL SZ 7 (GLOVE) ×1
GLOVE SURG SS PI 7.0 STRL IVOR (GLOVE) ×4 IMPLANT
GOWN PREVENTION PLUS XXLARGE (GOWN DISPOSABLE) ×4 IMPLANT
GOWN STRL NON-REIN LRG LVL3 (GOWN DISPOSABLE) ×11 IMPLANT
GRAFT GORETEX STRT 7X10 (Vascular Products) ×2 IMPLANT
KIT BASIN OR (CUSTOM PROCEDURE TRAY) ×3 IMPLANT
KIT ROOM TURNOVER OR (KITS) ×3 IMPLANT
NDL 18GX1X1/2 (RX/OR ONLY) (NEEDLE) ×1 IMPLANT
NDL HYPO 25GX1X1/2 BEV (NEEDLE) ×1 IMPLANT
NEEDLE 18GX1X1/2 (RX/OR ONLY) (NEEDLE) IMPLANT
NEEDLE HYPO 25GX1X1/2 BEV (NEEDLE) IMPLANT
NS IRRIG 1000ML POUR BTL (IV SOLUTION) ×3 IMPLANT
PACK CV ACCESS (CUSTOM PROCEDURE TRAY) ×3 IMPLANT
PACK SURGICAL SETUP 50X90 (CUSTOM PROCEDURE TRAY) ×1 IMPLANT
PAD ARMBOARD 7.5X6 YLW CONV (MISCELLANEOUS) ×6 IMPLANT
SOAP 2 % CHG 4 OZ (WOUND CARE) ×1 IMPLANT
SPONGE GAUZE 2X2 STER 10/PKG (GAUZE/BANDAGES/DRESSINGS)
SPONGE LAP 18X18 X RAY DECT (DISPOSABLE) ×2 IMPLANT
SPONGE SURGIFOAM ABS GEL 100 (HEMOSTASIS) IMPLANT
SUT ETHILON 3 0 PS 1 (SUTURE) ×1 IMPLANT
SUT MNCRL AB 4-0 PS2 18 (SUTURE) ×3 IMPLANT
SUT PROLENE 5 0 C 1 24 (SUTURE) ×8 IMPLANT
SUT PROLENE 6 0 BV (SUTURE) ×3 IMPLANT
SUT VIC AB 3-0 SH 27 (SUTURE) ×3
SUT VIC AB 3-0 SH 27X BRD (SUTURE) ×2 IMPLANT
SYR 20CC LL (SYRINGE) ×2 IMPLANT
SYR 30ML LL (SYRINGE) IMPLANT
SYR 3ML LL SCALE MARK (SYRINGE) ×3 IMPLANT
SYR 5ML LL (SYRINGE) ×1 IMPLANT
SYR CONTROL 10ML LL (SYRINGE) ×1 IMPLANT
SYR TB 1ML LUER SLIP (SYRINGE) ×2 IMPLANT
SYRINGE 10CC LL (SYRINGE) ×1 IMPLANT
TOWEL OR 17X24 6PK STRL BLUE (TOWEL DISPOSABLE) ×3 IMPLANT
TOWEL OR 17X26 10 PK STRL BLUE (TOWEL DISPOSABLE) ×3 IMPLANT
UNDERPAD 30X30 INCONTINENT (UNDERPADS AND DIAPERS) ×3 IMPLANT
WATER STERILE IRR 1000ML POUR (IV SOLUTION) ×3 IMPLANT

## 2012-11-23 NOTE — Anesthesia Postprocedure Evaluation (Signed)
  Anesthesia Post-op Note  Patient: Julia Love  Procedure(s) Performed: Procedure(s): THROMBECTOMY AND REVISION OF RIGHT ARM ARTERIOVENTOUS   GRAFT USING X 10CM THIN WALL STRETCH GORETEX GRAFT. (Right)  Patient Location: PACU  Anesthesia Type:MAC  Level of Consciousness: awake, alert  and oriented  Airway and Oxygen Therapy: Patient Spontanous Breathing and Patient connected to nasal cannula oxygen  Post-op Pain: none  Post-op Assessment: Post-op Vital signs reviewed, Patient's Cardiovascular Status Stable, Respiratory Function Stable, Patent Airway and No signs of Nausea or vomiting  Post-op Vital Signs: Reviewed and stable  Complications: No apparent anesthesia complications

## 2012-11-23 NOTE — Transfer of Care (Signed)
Immediate Anesthesia Transfer of Care Note  Patient: [name]  Procedure(s) Performed: Procedure(s): THROMBECTOMY AND REVISION OF RIGHT ARM ARTERIOVENTOUS   GRAFT USING X 10CM THIN WALL STRETCH GORETEX GRAFT. (Right)  Patient Location: PACU  Anesthesia Type:MAC  Level of Consciousness: awake, alert , oriented and patient cooperative  Airway & Oxygen Therapy: Patient Spontanous Breathing and Patient connected to nasal cannula oxygen  Post-op Assessment: Report given to PACU RN, Post -op Vital signs reviewed and stable and Patient moving all extremities  Post vital signs: Reviewed and stable  Complications: No apparent anesthesia complications

## 2012-11-23 NOTE — Anesthesia Preprocedure Evaluation (Addendum)
Anesthesia Evaluation  Patient identified by MRN, date of birth, ID band Patient awake    Reviewed: Allergy & Precautions, H&P , NPO status , Patient's Chart, lab work & pertinent test results  History of Anesthesia Complications Negative for: history of anesthetic complications  Airway Mallampati: I TM Distance: >3 FB Neck ROM: Full    Dental  (+) Edentulous Upper and Dental Advisory Given   Pulmonary neg pulmonary ROS, shortness of breath and with exertion,    Pulmonary exam normal       Cardiovascular hypertension, Pt. on medications     Neuro/Psych negative neurological ROS  negative psych ROS   GI/Hepatic PUD, GERD-  Medicated and Controlled,  Endo/Other  negative endocrine ROS  Renal/GU CRF and DialysisRenal disease     Musculoskeletal  (+) Arthritis -, Osteoarthritis,    Abdominal   Peds  Hematology negative hematology ROS (+)   Anesthesia Other Findings   Reproductive/Obstetrics                        Anesthesia Physical Anesthesia Plan  ASA: III  Anesthesia Plan: MAC   Post-op Pain Management:    Induction: Intravenous  Airway Management Planned: Simple Face Mask  Additional Equipment:   Intra-op Plan:   Post-operative Plan:   Informed Consent: I have reviewed the patients History and Physical, chart, labs and discussed the procedure including the risks, benefits and alternatives for the proposed anesthesia with the patient or authorized representative who has indicated his/her understanding and acceptance.   Dental advisory given  Plan Discussed with: CRNA, Anesthesiologist and Surgeon  Anesthesia Plan Comments:        Anesthesia Quick Evaluation

## 2012-11-23 NOTE — Op Note (Signed)
OPERATIVE NOTE   PROCEDURE:  Thrombectomy and revision of right forearm arteriovenous graft  PRE-OPERATIVE DIAGNOSIS: thrombosed right forearm arteriovenous graft   POST-OPERATIVE DIAGNOSIS: same as above   SURGEON: Leonides Sake, MD  ASSISTANT(S): Lianne Cure, PAC   ANESTHESIA: local and MAC  ESTIMATED BLOOD LOSS: 50 cc  FINDING(S): 1. Thrombus at valve occluding outflow of the arteriovenous graft   SPECIMEN(S):  none  INDICATIONS:   Julia Love is a 79 y.o. female who  presents with thrombosed right forearm arteriovenous graft despite recent percutaneous thrombectomy.  Risk, benefits, and alternatives to access surgery were discussed.  The patient is aware the risks include but are not limited to: bleeding, infection, steal syndrome, nerve damage, ischemic monomelic neuropathy, failure to mature, and need for additional procedures.  The patient is aware of the risks and elects to proceed forward.  DESCRIPTION: After full informed written consent was obtained from the patient, the patient was brought back to the operating room and placed supine upon the operating table.  The patient was given IV antibiotics prior to proceeding.  After obtaining adequate sedation, the patient was prepped and draped in standard fashion for a right arm access procedure.  I turned my attention first to the antecubitum where the venous anastomosis was located.  I injected 20 cc of a mixture of 1% lidocaine with epinephrine and 0.5% Marcaine without epinephrine throughout this case to obtain anesthesia.  I made an incision over the venous anastomosis of the arteriovenous graft and dissected down to the graft.  I dissected out the graft distally and the vein proximally.  I was able to identify a proximal vein, 6 mm in diameter externally.  About 2 cm proximal to the anastomosis was a discrete ball of thrombosis in the vein and no palpable cord distal to this ball.  I gave 5000 units of Heparin intravenously.   After waiting 3 minutes, I then took down the venous anastomosis sharply with a 11-blade.  I passed a 4 Fogarty balloon proximally through the vein, retrieving: a small amount of clot from the vein.  I then opened up the venous anastomosis longitudinally, demonstrating: extensive valves at the site of the ball of thrombosis.  I then passed a 3 Fogarty balloon distally but I could not retrieve the arterial meniscus.  I injected some local anesthesic over the distal segment of the arterial arm of this arteriovenous graft.  I made an incision and dissected out the graft with blunt dissection and electrocautery.  I made an graftotomy and passed the 4 Fogarty toward the venous end of this graft.  I made a few passes and extracted all clot from the graft.  I then passed a 3 Fogarty proximally, retrieving some thrombus but not the arterial meniscus.  I then passed a 4 Fogarty proximally eventually getting through the anastomosis.  I was able to extract the arterial plug and get return of pulsatile blood in the arteriovenous graft.  I made another pass without any thrombus obtained.  The graft was clamped.  I then obtained a short segment of 7 mm Goretex and pulled it to full length.  I transected a short segment of the proximal old graft.  The new graft was sewn to the old graft in an end-to-end configuration with a running stitch of 5-0 Prolene.  I released the clamp on the old graft to test the new anastomosis.  There was minimal bleeding from the anastomosis.  I reclamped the old graft and then reset  my exposure of the proximal vein.  I sharply explored the proximal vein until I found a pliable segment without significant scar tissue.  I spatulated the vein to facilitate an end-to-end anastomosis.  The proximal new graft was spatulated, cutting the length in process, to facilitate the anastomosis.  The graft was sewn to the vein in an end-to-end fashion with 5-0 Prolene.  Prior to completing the anastomosis, the graft  was bled: there was good pulsatile bleeding.  Also the vein was backbled: there was good retrograde bleeding.   The anastomosis was completed in the usual fashion.  All clamps were removed.  There was a palpable thrill in the arteriovenous graft.  The radial artery was dopplerable.  The proximal vein had a dopplerable flow signature consistent with a widely patent arteriovenous graft.  I placed thrombin and Gelfoam in the axillary incision.  I also gave 30 mg of Protamine.  After waiting a few minutes, I removed all the thrombin and Gelfoam and washed out the wound.  There was no more active bleeding.  The subcutaneous tissue in all incisions were repaired with running stitch of 3-0 Vicryl.  The skin in all incisions were was reapproximated with a running subcuticular 4-0 Monocryl.  The skin was then cleaned, dried, and then the skin closure was reinforced with Dermabond at all incisions.    COMPLICATIONS: none  CONDITION: stable   Nilda Simmer, MD 11/23/2012 4:26 PM

## 2012-11-23 NOTE — H&P (Signed)
[facility] [place]  Brief History and Physical  History of Present Illness  Julia Love is a 79 y.o. female who presents with chief complaint: thrombosed R FA AVG.  The patient presents today for thrombectomy and revision of R FA AVG, possible TDC placement.    Past Medical History  Diagnosis Date  . S/P tonsillectomy     1945  . History of nephrectomy     (right) for a non-functioning kidney in 1949  . S/P complete hysterectomy     1990  . Hypertension     for several years  . Fistula     rt arm for dialysis which is not in use at present  . Hemodialysis patient     since November 2005  . Gastric ulcer     back in 1989  . Parathyroid disorder     surgery in August 2011  . Rectocele     repair several years ago  . GERD (gastroesophageal reflux disease)   . S/P laparoscopic cholecystectomy   . Cancer     Colon cancer  . ESRD (end stage renal disease) on dialysis     Past Surgical History  Procedure Laterality Date  . Abdominal hysterectomy  1990  . Colonoscopy  10/20/2011    Procedure: COLONOSCOPY;  Surgeon: Malissa Hippo, MD;  [facility];  Service: Endoscopy;  Laterality: N/A;  100  . Esophagogastroduodenoscopy  10/20/2011    Procedure: ESOPHAGOGASTRODUODENOSCOPY (EGD);  Surgeon: Malissa Hippo, MD;  [facility];  Service: Endoscopy;  Laterality: N/A;  Possible EGD  . Laparoscopic nephrectomy  1949  . Eye surgery  2009, 2005    bilateral  . Dg av dialysis graft declot or    . Cholecystectomy  08  . Parathyroidectomy  07/07/09  . Rectocele repair    . Tonsillectomy    . Colonoscopy  11/05/2011    Procedure: COLONOSCOPY;  Surgeon: Malissa Hippo, MD;  [facility];  Service: Endoscopy;  Laterality: N/A;  730    History   Social History  . Marital Status: Widowed    Spouse Name: N/A    Number of Children: N/A  . Years of Education: N/A   Occupational History  . Not on file.   Social History  Main Topics  . Smoking status: Former Smoker -- 1.00 packs/day for 15 years    Types: Cigarettes    Quit date: 07/11/1986  . Smokeless tobacco: Never Used     Comment: Patient smoked 1 pack a day  . Alcohol Use: No  . Drug Use: No  . Sexually Active: Not on file   Other Topics Concern  . Not on file   Social History Narrative  . No narrative on file    Family History  Problem Relation Age of Onset  . Hypertension Mother   . Stroke Mother   . Hypertension Father   . Stroke Father   . Hypertension Daughter   . Hypertension Daughter   . Healthy Son   . Colon cancer Neg Hx     No current facility-administered medications on file prior to encounter.   Current Outpatient Prescriptions on File Prior to Encounter  Medication Sig Dispense Refill  . allopurinol (ZYLOPRIM) 100 MG tablet Take 100 mg by mouth daily.       Marland Kitchen amLODipine (NORVASC) 5 MG tablet Take 5 mg by mouth at bedtime.       Marland Kitchen aspirin EC 81  MG tablet Take 81 mg by mouth daily.      . cinacalcet (SENSIPAR) 60 MG tablet Take 60 mg by mouth at bedtime.      Marland Kitchen FIBER FORMULA PO Take by mouth. Patient states that she takes 2 tablespoons up to 2-3 times daily      . lanthanum (FOSRENOL) 1000 MG chewable tablet Chew 1,000 mg by mouth 2 (two) times daily with a meal. With snacks      . lisinopril (PRINIVIL,ZESTRIL) 20 MG tablet Take 40 mg by mouth at bedtime.       . Multiple Vitamins-Minerals (DIALYVITE 800/ULTRA D PO) Take 1 tablet by mouth daily.       Marland Kitchen omeprazole (PRILOSEC) 20 MG capsule Take 20 mg by mouth daily.         Allergies  Allergen Reactions  . Vicodin (Hydrocodone-Acetaminophen) Other (See Comments)    Makes sleep for long periods of time  . Contrast Media (Iodinated Diagnostic Agents) Itching    Review of Systems: As listed above, otherwise negative.  Physical Examination  Filed Vitals:   11/23/12 0836 11/23/12 0954  BP: 158/63   Pulse: 81   Temp: 97.8 F (36.6 C)   TempSrc: Oral   Resp: 18    Height:  5\' 4"  (1.626 m)  Weight:  152 lb 8.9 oz (69.2 kg)  SpO2: 98%     General: A&O x 3, WDWN  Pulmonary: Sym exp, good air movt, CTAB, no rales, rhonchi, & wheezing  Cardiac: RRR, Nl S1, S2, no Murmurs, rubs or gallops  Gastrointestinal: soft, NTND, -G/R, - HSM, - masses, - CVAT B  Musculoskeletal: M/S 5/5 throughout , Extremities without ischemic changes , thrombosed R FA AVG  Laboratory See iStat  Medical Decision Making  Julia Love is a 79 y.o. female who presents with: thrombosed R FA AVG.   The patient is scheduled for: R FA AVG T&R , possible TDC placement  Risk, benefits, and alternatives to access surgery were discussed.  The patient is aware the risks include but are not limited to: bleeding, infection, steal syndrome, nerve damage, ischemic monomelic neuropathy, failure to mature, and need for additional procedures. The patient is aware the risks of tunneled dialysis catheter placement include but are not limited to: bleeding, infection, central venous injury, pneumothorax, possible venous stenosis, possible malpositioning in the venous system, and possible infections related to long-term catheter presence.   The patient is aware of the risks and agrees to proceed.  Leonides Sake, MD [facility] [place] [facility] Office: [phone] Pager: [phone]  11/23/2012, 2:20 PM

## 2012-11-23 NOTE — Preoperative (Signed)
Beta Blockers   Reason not to administer Beta Blockers:Not Applicable 

## 2012-11-24 ENCOUNTER — Telehealth: Payer: Self-pay | Admitting: Vascular Surgery

## 2012-11-24 NOTE — Telephone Encounter (Signed)
Gave pt appt info - kf °

## 2012-11-24 NOTE — Telephone Encounter (Signed)
Message copied by Margaretmary Eddy on Fri Nov 24, 2012 11:24 AM ------      Message from: Melene Plan      Created: Fri Nov 24, 2012  9:08 AM                   ----- Message -----         From: Fransisco Hertz, MD         Sent: 11/23/2012   4:38 PM           To: Reuel Derby, Melene Plan, RN            Julia Love      [id number]      January 05, 1929                  PROCEDURE:  Thrombectomy and revision of right forearm arteriovenous graft            Asst: Lianne Cure, [facility]             Follow-up: 4-6 weeks       ------

## 2012-11-25 ENCOUNTER — Encounter (HOSPITAL_COMMUNITY): Payer: Self-pay | Admitting: Vascular Surgery

## 2012-11-29 ENCOUNTER — Telehealth (INDEPENDENT_AMBULATORY_CARE_PROVIDER_SITE_OTHER): Payer: Self-pay | Admitting: *Deleted

## 2012-11-29 ENCOUNTER — Other Ambulatory Visit: Payer: Self-pay | Admitting: *Deleted

## 2012-11-29 DIAGNOSIS — D649 Anemia, unspecified: Secondary | ICD-10-CM

## 2012-11-29 DIAGNOSIS — K7460 Unspecified cirrhosis of liver: Secondary | ICD-10-CM

## 2012-11-29 DIAGNOSIS — R978 Other abnormal tumor markers: Secondary | ICD-10-CM

## 2012-11-29 NOTE — Telephone Encounter (Signed)
Dr.[name] I have looked and cannot see if that any labs were requested prior to her April visit. Review and let me know so that I can order if needed

## 2012-11-29 NOTE — Telephone Encounter (Signed)
Labs are noted 

## 2012-11-29 NOTE — Telephone Encounter (Signed)
I have faxed lab orders to [facility] and I have called and left a message on Hilda's voicemail at home that I had done this. I also ask if I needed to fax it to Dialysis to call and let me know and I would fax it to them.

## 2012-11-29 NOTE — Telephone Encounter (Signed)
Forward to Dr.[name] 

## 2012-11-29 NOTE — Telephone Encounter (Signed)
Need to following; AFP and CEA. CBC and comprehensive chemistry panel unless.in the last 3 months.

## 2012-11-29 NOTE — Telephone Encounter (Signed)
Julia Love has an apt  Scheduled for 01/04/13 and Julia Love is wanting to know if a test and blood work is needed before the visit. The return phone number is [phone] or [phone].

## 2012-11-30 ENCOUNTER — Encounter (HOSPITAL_COMMUNITY): Payer: Self-pay | Admitting: *Deleted

## 2012-11-30 ENCOUNTER — Encounter (HOSPITAL_COMMUNITY): Payer: Self-pay | Admitting: Pharmacy Technician

## 2012-11-30 MED ORDER — DEXTROSE 5 % IV SOLN
1.5000 g | INTRAVENOUS | Status: AC
  Administered 2012-12-01: 1.5 g via INTRAVENOUS
  Filled 2012-11-30: qty 1.5

## 2012-11-30 MED ORDER — PROMETHAZINE HCL 25 MG/ML IJ SOLN: 6.2500 mg | INTRAMUSCULAR | Status: DC | PRN

## 2012-11-30 MED ORDER — SODIUM CHLORIDE 0.9 % IV SOLN: INTRAVENOUS | Status: DC

## 2012-11-30 MED ORDER — HYDROMORPHONE HCL PF 1 MG/ML IJ SOLN: 0.2500 mg | INTRAMUSCULAR | Status: DC | PRN

## 2012-12-01 ENCOUNTER — Encounter (HOSPITAL_COMMUNITY): Payer: Self-pay | Admitting: Anesthesiology

## 2012-12-01 ENCOUNTER — Encounter (HOSPITAL_COMMUNITY): Payer: Self-pay | Admitting: *Deleted

## 2012-12-01 ENCOUNTER — Ambulatory Visit (HOSPITAL_COMMUNITY): Payer: Medicare Other | Admitting: Anesthesiology

## 2012-12-01 ENCOUNTER — Ambulatory Visit (HOSPITAL_COMMUNITY)
Admission: RE | Admit: 2012-12-01 | Discharge: 2012-12-01 | Disposition: A | Discharge status: Home / Self Care | Payer: Medicare Other | Source: Ambulatory Visit | Attending: Vascular Surgery | Admitting: Vascular Surgery

## 2012-12-01 ENCOUNTER — Encounter (HOSPITAL_COMMUNITY)
Admission: RE | Disposition: A | Discharge status: Home / Self Care | Payer: Self-pay | Source: Ambulatory Visit | Attending: Vascular Surgery

## 2012-12-01 DIAGNOSIS — Z905 Acquired absence of kidney: Secondary | ICD-10-CM | POA: Insufficient documentation

## 2012-12-01 DIAGNOSIS — K219 Gastro-esophageal reflux disease without esophagitis: Secondary | ICD-10-CM | POA: Insufficient documentation

## 2012-12-01 DIAGNOSIS — Z91041 Radiographic dye allergy status: Secondary | ICD-10-CM | POA: Insufficient documentation

## 2012-12-01 DIAGNOSIS — N186 End stage renal disease: Secondary | ICD-10-CM | POA: Insufficient documentation

## 2012-12-01 DIAGNOSIS — Z885 Allergy status to narcotic agent status: Secondary | ICD-10-CM | POA: Insufficient documentation

## 2012-12-01 DIAGNOSIS — I120 Hypertensive chronic kidney disease with stage 5 chronic kidney disease or end stage renal disease: Secondary | ICD-10-CM | POA: Insufficient documentation

## 2012-12-01 DIAGNOSIS — Z87891 Personal history of nicotine dependence: Secondary | ICD-10-CM | POA: Insufficient documentation

## 2012-12-01 DIAGNOSIS — Z79899 Other long term (current) drug therapy: Secondary | ICD-10-CM | POA: Insufficient documentation

## 2012-12-01 DIAGNOSIS — Z7982 Long term (current) use of aspirin: Secondary | ICD-10-CM | POA: Insufficient documentation

## 2012-12-01 DIAGNOSIS — M1990 Unspecified osteoarthritis, unspecified site: Secondary | ICD-10-CM | POA: Insufficient documentation

## 2012-12-01 DIAGNOSIS — Z85038 Personal history of other malignant neoplasm of large intestine: Secondary | ICD-10-CM | POA: Insufficient documentation

## 2012-12-01 DIAGNOSIS — Y832 Surgical operation with anastomosis, bypass or graft as the cause of abnormal reaction of the patient, or of later complication, without mention of misadventure at the time of the procedure: Secondary | ICD-10-CM | POA: Insufficient documentation

## 2012-12-01 DIAGNOSIS — T82898A Other specified complication of vascular prosthetic devices, implants and grafts, initial encounter: Secondary | ICD-10-CM | POA: Insufficient documentation

## 2012-12-01 HISTORY — DX: Cardiac murmur, unspecified: R01.1

## 2012-12-01 HISTORY — DX: Gout, unspecified: M10.9

## 2012-12-01 HISTORY — DX: Personal history of other diseases of the digestive system: Z87.19

## 2012-12-01 HISTORY — PX: THROMBECTOMY AND REVISION OF ARTERIOVENTOUS (AV) GORETEX  GRAFT: SHX6120

## 2012-12-01 HISTORY — DX: Shortness of breath: R06.02

## 2012-12-01 LAB — POCT I-STAT 4, (NA,K, GLUC, HGB,HCT)
Glucose, Bld: 89 mg/dL (ref 70–99)
Potassium: 4.2 mEq/L (ref 3.5–5.1)
Sodium: 141 mEq/L (ref 135–145)

## 2012-12-01 SURGERY — THROMBECTOMY AND REVISION OF ARTERIOVENTOUS (AV) GORETEX  GRAFT
Anesthesia: Monitor Anesthesia Care | Site: Arm Lower | Laterality: Right | Wound class: Clean

## 2012-12-01 MED ORDER — FENTANYL CITRATE 0.05 MG/ML IJ SOLN
INTRAMUSCULAR | Status: DC | PRN
  Administered 2012-12-01: 25 ug via INTRAVENOUS
  Administered 2012-12-01: 50 ug via INTRAVENOUS
  Administered 2012-12-01 (×2): 25 ug via INTRAVENOUS

## 2012-12-01 MED ORDER — LIDOCAINE-EPINEPHRINE (PF) 1 %-1:200000 IJ SOLN
INTRAMUSCULAR | Status: DC | PRN
  Administered 2012-12-01: 27 mL

## 2012-12-01 MED ORDER — 0.9 % SODIUM CHLORIDE (POUR BTL) OPTIME
TOPICAL | Status: DC | PRN
  Administered 2012-12-01: 1000 mL

## 2012-12-01 MED ORDER — MIDAZOLAM HCL 5 MG/5ML IJ SOLN
INTRAMUSCULAR | Status: DC | PRN
  Administered 2012-12-01 (×2): 1 mg via INTRAVENOUS

## 2012-12-01 MED ORDER — PROPOFOL INFUSION 10 MG/ML OPTIME
INTRAVENOUS | Status: DC | PRN
  Administered 2012-12-01: 50 ug/kg/min via INTRAVENOUS

## 2012-12-01 MED ORDER — SODIUM CHLORIDE 0.9 % IR SOLN
Status: DC | PRN
  Administered 2012-12-01: 10:00:00

## 2012-12-01 MED ORDER — LIDOCAINE-EPINEPHRINE (PF) 1 %-1:200000 IJ SOLN
INTRAMUSCULAR | Status: AC
  Filled 2012-12-01: qty 10

## 2012-12-01 MED ORDER — OXYCODONE HCL 5 MG PO TABS: 5.0000 mg | ORAL_TABLET | ORAL | Status: DC | PRN

## 2012-12-01 MED ORDER — LIDOCAINE HCL (CARDIAC) 20 MG/ML IV SOLN
INTRAVENOUS | Status: DC | PRN
  Administered 2012-12-01: 40 mg via INTRAVENOUS

## 2012-12-01 MED ORDER — SODIUM CHLORIDE 0.9 % IV SOLN
INTRAVENOUS | Status: DC | PRN
  Administered 2012-12-01: 09:00:00 via INTRAVENOUS

## 2012-12-01 SURGICAL SUPPLY — 31 items
ADH SKN CLS APL DERMABOND .7 (GAUZE/BANDAGES/DRESSINGS) ×1
CANISTER SUCTION 2500CC (MISCELLANEOUS) ×2 IMPLANT
CATH EMB 5FR 80CM (CATHETERS) ×3 IMPLANT
CLIP TI MEDIUM 6 (CLIP) ×2 IMPLANT
CLIP TI WIDE RED SMALL 6 (CLIP) ×2 IMPLANT
CLOTH BEACON ORANGE TIMEOUT ST (SAFETY) ×2 IMPLANT
COVER SURGICAL LIGHT HANDLE (MISCELLANEOUS) ×2 IMPLANT
DERMABOND ADVANCED (GAUZE/BANDAGES/DRESSINGS) ×1
DERMABOND ADVANCED .7 DNX12 (GAUZE/BANDAGES/DRESSINGS) ×1 IMPLANT
ELECT REM PT RETURN 9FT ADLT (ELECTROSURGICAL) ×2
ELECTRODE REM PT RTRN 9FT ADLT (ELECTROSURGICAL) ×1 IMPLANT
GEL ULTRASOUND 20GR AQUASONIC (MISCELLANEOUS) ×1 IMPLANT
GLOVE SS BIOGEL STRL SZ 7 (GLOVE) ×1 IMPLANT
GLOVE SUPERSENSE BIOGEL SZ 7 (GLOVE) ×1
GOWN STRL NON-REIN LRG LVL3 (GOWN DISPOSABLE) ×4 IMPLANT
KIT BASIN OR (CUSTOM PROCEDURE TRAY) ×2 IMPLANT
KIT ROOM TURNOVER OR (KITS) ×2 IMPLANT
NS IRRIG 1000ML POUR BTL (IV SOLUTION) ×2 IMPLANT
PACK CV ACCESS (CUSTOM PROCEDURE TRAY) ×2 IMPLANT
PAD ARMBOARD 7.5X6 YLW CONV (MISCELLANEOUS) ×4 IMPLANT
SPONGE GAUZE 4X4 12PLY (GAUZE/BANDAGES/DRESSINGS) ×2 IMPLANT
SPONGE LAP 18X18 X RAY DECT (DISPOSABLE) ×1 IMPLANT
SUT PROLENE 6 0 BV (SUTURE) ×2 IMPLANT
SUT PROLENE 6 0 CC (SUTURE) ×1 IMPLANT
SUT VIC AB 3-0 SH 27 (SUTURE) ×4
SUT VIC AB 3-0 SH 27X BRD (SUTURE) ×1 IMPLANT
SUT VIC AB 3-0 X1 27 (SUTURE) ×1 IMPLANT
TOWEL OR 17X24 6PK STRL BLUE (TOWEL DISPOSABLE) ×2 IMPLANT
TOWEL OR 17X26 10 PK STRL BLUE (TOWEL DISPOSABLE) ×2 IMPLANT
UNDERPAD 30X30 INCONTINENT (UNDERPADS AND DIAPERS) ×2 IMPLANT
WATER STERILE IRR 1000ML POUR (IV SOLUTION) ×2 IMPLANT

## 2012-12-01 NOTE — Anesthesia Preprocedure Evaluation (Addendum)
Anesthesia Evaluation  Patient identified by MRN, date of birth, ID band Patient awake    Reviewed: Allergy & Precautions, H&P , NPO status , Patient's Chart, lab work & pertinent test results, reviewed documented beta blocker date and time   History of Anesthesia Complications Negative for: history of anesthetic complications  Airway Mallampati: II TM Distance: >3 FB Neck ROM: Full    Dental  (+) Dental Advisory Given and Edentulous Upper   Pulmonary shortness of breath and with exertion, former smoker,  breath sounds clear to auscultation        Cardiovascular hypertension, Pt. on medications + Valvular Problems/Murmurs Rhythm:Regular Rate:Normal     Neuro/Psych negative neurological ROS  negative psych ROS   GI/Hepatic hiatal hernia, PUD, GERD-  Medicated and Controlled,  Endo/Other  negative endocrine ROS  Renal/GU Dialysis and ESRFRenal disease     Musculoskeletal negative musculoskeletal ROS (+)   Abdominal   Peds  Hematology negative hematology ROS (+)   Anesthesia Other Findings   Reproductive/Obstetrics negative OB ROS                         Anesthesia Physical Anesthesia Plan  ASA: III  Anesthesia Plan: MAC   Post-op Pain Management:    Induction:   Airway Management Planned: Natural Airway and Simple Face Mask  Additional Equipment:   Intra-op Plan:   Post-operative Plan:   Informed Consent: I have reviewed the patients History and Physical, chart, labs and discussed the procedure including the risks, benefits and alternatives for the proposed anesthesia with the patient or authorized representative who has indicated his/her understanding and acceptance.   Dental advisory given  Plan Discussed with: CRNA, Surgeon and Anesthesiologist  Anesthesia Plan Comments:         Anesthesia Quick Evaluation

## 2012-12-01 NOTE — Anesthesia Postprocedure Evaluation (Signed)
  Anesthesia Post-op Note  Patient: Julia Love  Procedure(s) Performed: Procedure(s): THROMBECTOMY AND REVISION OF ARTERIOVENTOUS (AV) GORETEX  GRAFT (Right)  Patient Location: PACU  Anesthesia Type:General  Level of Consciousness: awake, alert  and oriented  Airway and Oxygen Therapy: Patient Spontanous Breathing  Post-op Pain: none  Post-op Assessment: Post-op Vital signs reviewed, Patient's Cardiovascular Status Stable, Respiratory Function Stable, Patent Airway and Pain level controlled  Post-op Vital Signs: stable  Complications: No apparent anesthesia complications

## 2012-12-01 NOTE — Op Note (Signed)
OPERATIVE REPORT  Date of Surgery: 12/01/2012  Surgeon: Josephina Gip, MD  Assistant Clearence Ped    Pre-op Diagnosis: clotted AVG-Recurrent-right upper extremity  Post-op Diagnosis: same  Procedure: Procedure(s): THROMBECTOMY AND REVISION OF ARTERIOVENTOUS (AV) GORETEX  GRAFT  Right forearm with exploration of venous end and shortening of the graft   Anesthesia: MAC   EBL: Minimal  Complications: None  Procedure Details: Patient was taken to the operating room placed in supine position at which time the right upper extremity was prepped with Betadine scrub and solution draped in routine sterile manner. After infiltration with 1% Xylocaine with epinephrine a short transverse incision was made overlying the right forearm graft. The graft was dissected free a transverse opening was made in the graft which was totally thrombosed. The Fogarty was passed down the arterial and which traversed the arterial anastomosis easily. There were some aneurysmal changes in the midportion of the arterial half of the graft the Fogarty traversed this easily I could easily pass a 5 dilator through this there was excellent flow present. Phobias and passed around the venous end which had been revised one week earlier into the basilic vein slightly more proximally. The Fogarty was having difficulty traversing this anastomosis which had just been revised. Multiple passes were performed it was difficult to traverse this. Therefore the old incision was re\re anesthetized with 1% Xylocaine with epinephrine and opened it was a longitudinal incision in the antecubital area area extending up into the distal upper arm. The graft to basilic vein anastomosis was exposed transverse opening made in the graft. The vein was mobilized about 3-4 cm proximally branches ligated with 3-0 silk ties and divided. The large caliber vein it is 5 mm. The Fogarty again had difficulty traversing the vein in the same the problem was there was  some redundancy in the vein. By pulling the graft distally the Fogarty would easily traversed this up into the shoulder level upon return was excellent venous back bleeding. Therefore decided to shorten this graft to make less redundancy of the vein. About 1/2-2 cm segment of the graft was removed graft reanastomosed with 6-0 Prolene. The transverse opening in the graft at the 6:00 position was also reapproximated with 6-0 Prolene. Clamps were then released there was excellent pulse and thrill in the graft area no heparin or protamine was given. The wound was closed in layers of Vicryl in subcuticular fashion with Dermabond patient to recovery in stable condition  If this graft should reocclude soon patient will need a hemodialysis catheter and likely an upper arm graft on the right side. The only other thing to revise on this graft would be replacement of the arterial half of the loop.   Josephina Gip, MD 12/01/2012 10:46 AM

## 2012-12-01 NOTE — Preoperative (Signed)
Beta Blockers   Reason not to administer Beta Blockers:Not Applicable 

## 2012-12-01 NOTE — Transfer of Care (Signed)
Immediate Anesthesia Transfer of Care Note  Patient: Julia Love  Procedure(s) Performed: Procedure(s): THROMBECTOMY AND REVISION OF ARTERIOVENTOUS (AV) GORETEX  GRAFT (Right)  Patient Location: PACU  Anesthesia Type:MAC  Level of Consciousness: awake, alert  and oriented  Airway & Oxygen Therapy: Patient Spontanous Breathing and Patient connected to nasal cannula oxygen  Post-op Assessment: Report given to PACU RN and Post -op Vital signs reviewed and stable  Post vital signs: Reviewed and stable  Complications: No apparent anesthesia complications

## 2012-12-01 NOTE — Interval H&P Note (Signed)
History and Physical Interval Note:  12/01/2012 8:49 AM  Julia Love  has presented today for surgery, with the diagnosis of clotted AVG  The various methods of treatment have been discussed with the patient and family. After consideration of risks, benefits and other options for treatment, the patient has consented to  Procedure(s): THROMBECTOMY AND REVISION OF ARTERIOVENTOUS (AV) GORETEX  GRAFT (Right) as a surgical intervention .  The patient's history has been reviewed, patient examined, no change in status, stable for surgery.  I have reviewed the patient's chart and labs.  Questions were answered to the patient's satisfaction.     Josephina Gip

## 2012-12-01 NOTE — H&P (View-Only) (Signed)
[facility] [place]  Brief History and Physical  History of Present Illness  Julia Love is a 80 y.o. female who presents with chief complaint: thrombosed R FA AVG.  The patient presents today for thrombectomy and revision of R FA AVG, possible TDC placement.    Past Medical History  Diagnosis Date  . S/P tonsillectomy     1945  . History of nephrectomy     (right) for a non-functioning kidney in 1949  . S/P complete hysterectomy     1990  . Hypertension     for several years  . Fistula     rt arm for dialysis which is not in use at present  . Hemodialysis patient     since November 2005  . Gastric ulcer     back in 1989  . Parathyroid disorder     surgery in August 2011  . Rectocele     repair several years ago  . GERD (gastroesophageal reflux disease)   . S/P laparoscopic cholecystectomy   . Cancer     Colon cancer  . ESRD (end stage renal disease) on dialysis     Past Surgical History  Procedure Laterality Date  . Abdominal hysterectomy  1990  . Colonoscopy  10/20/2011    Procedure: COLONOSCOPY;  Surgeon: Malissa Hippo, MD;  [facility];  Service: Endoscopy;  Laterality: N/A;  100  . Esophagogastroduodenoscopy  10/20/2011    Procedure: ESOPHAGOGASTRODUODENOSCOPY (EGD);  Surgeon: Malissa Hippo, MD;  Location: [facility];  Service: Endoscopy;  Laterality: N/A;  Possible EGD  . Laparoscopic nephrectomy  1949  . Eye surgery  2009, 2005    bilateral  . Dg av dialysis graft declot or    . Cholecystectomy  08  . Parathyroidectomy  07/07/09  . Rectocele repair    . Tonsillectomy    . Colonoscopy  11/05/2011    Procedure: COLONOSCOPY;  Surgeon: Malissa Hippo, MD;  [facility];  Service: Endoscopy;  Laterality: N/A;  730    History   Social History  . Marital Status: Widowed    Spouse Name: N/A    Number of Children: N/A  . Years of Education: N/A   Occupational History  . Not on file.   Social History  Main Topics  . Smoking status: Former Smoker -- 1.00 packs/day for 15 years    Types: Cigarettes    Quit date: 07/11/1986  . Smokeless tobacco: Never Used     Comment: Patient smoked 1 pack a day  . Alcohol Use: No  . Drug Use: No  . Sexually Active: Not on file   Other Topics Concern  . Not on file   Social History Narrative  . No narrative on file    Family History  Problem Relation Age of Onset  . Hypertension Mother   . Stroke Mother   . Hypertension Father   . Stroke Father   . Hypertension Daughter   . Hypertension Daughter   . Healthy Son   . Colon cancer Neg Hx     No current facility-administered medications on file prior to encounter.   Current Outpatient Prescriptions on File Prior to Encounter  Medication Sig Dispense Refill  . allopurinol (ZYLOPRIM) 100 MG tablet Take 100 mg by mouth daily.       Marland Kitchen amLODipine (NORVASC) 5 MG tablet Take 5 mg by mouth at bedtime.       Marland Kitchen aspirin EC 81  MG tablet Take 81 mg by mouth daily.      . cinacalcet (SENSIPAR) 60 MG tablet Take 60 mg by mouth at bedtime.      Marland Kitchen FIBER FORMULA PO Take by mouth. Patient states that she takes 2 tablespoons up to 2-3 times daily      . lanthanum (FOSRENOL) 1000 MG chewable tablet Chew 1,000 mg by mouth 2 (two) times daily with a meal. With snacks      . lisinopril (PRINIVIL,ZESTRIL) 20 MG tablet Take 40 mg by mouth at bedtime.       . Multiple Vitamins-Minerals (DIALYVITE 800/ULTRA D PO) Take 1 tablet by mouth daily.       Marland Kitchen omeprazole (PRILOSEC) 20 MG capsule Take 20 mg by mouth daily.         Allergies  Allergen Reactions  . Vicodin (Hydrocodone-Acetaminophen) Other (See Comments)    Makes sleep for long periods of time  . Contrast Media (Iodinated Diagnostic Agents) Itching    Review of Systems: As listed above, otherwise negative.  Physical Examination  Filed Vitals:   11/23/12 0836 11/23/12 0954  BP: 158/63   Pulse: 81   Temp: 97.8 F (36.6 C)   TempSrc: Oral   Resp: 18    Height:  5\' 4"  (1.626 m)  Weight:  152 lb 8.9 oz (69.2 kg)  SpO2: 98%     General: A&O x 3, WDWN  Pulmonary: Sym exp, good air movt, CTAB, no rales, rhonchi, & wheezing  Cardiac: RRR, Nl S1, S2, no Murmurs, rubs or gallops  Gastrointestinal: soft, NTND, -G/R, - HSM, - masses, - CVAT B  Musculoskeletal: M/S 5/5 throughout , Extremities without ischemic changes , thrombosed R FA AVG  Laboratory See iStat  Medical Decision Making  Julia Love is a 79 y.o. female who presents with: thrombosed R FA AVG.   The patient is scheduled for: R FA AVG T&R , possible TDC placement  Risk, benefits, and alternatives to access surgery were discussed.  The patient is aware the risks include but are not limited to: bleeding, infection, steal syndrome, nerve damage, ischemic monomelic neuropathy, failure to mature, and need for additional procedures. The patient is aware the risks of tunneled dialysis catheter placement include but are not limited to: bleeding, infection, central venous injury, pneumothorax, possible venous stenosis, possible malpositioning in the venous system, and possible infections related to long-term catheter presence.   The patient is aware of the risks and agrees to proceed.  Leonides Sake, MD [facility] Office: [phone] Pager: [phone]  11/23/2012, 2:20 PM

## 2012-12-02 ENCOUNTER — Encounter (HOSPITAL_COMMUNITY): Payer: Self-pay | Admitting: Emergency Medicine

## 2012-12-02 ENCOUNTER — Emergency Department (HOSPITAL_COMMUNITY): Payer: Medicare Other

## 2012-12-02 ENCOUNTER — Observation Stay (HOSPITAL_COMMUNITY)
Admission: RE | Admit: 2012-12-02 | Discharge: 2012-12-02 | Disposition: A | Discharge status: Home / Self Care | Payer: Medicare Other | Source: Ambulatory Visit | Attending: Vascular Surgery | Admitting: Vascular Surgery

## 2012-12-02 DIAGNOSIS — N186 End stage renal disease: Secondary | ICD-10-CM

## 2012-12-02 DIAGNOSIS — I120 Hypertensive chronic kidney disease with stage 5 chronic kidney disease or end stage renal disease: Secondary | ICD-10-CM | POA: Insufficient documentation

## 2012-12-02 DIAGNOSIS — I10 Essential (primary) hypertension: Secondary | ICD-10-CM | POA: Insufficient documentation

## 2012-12-02 DIAGNOSIS — D649 Anemia, unspecified: Secondary | ICD-10-CM

## 2012-12-02 DIAGNOSIS — R112 Nausea with vomiting, unspecified: Secondary | ICD-10-CM | POA: Insufficient documentation

## 2012-12-02 DIAGNOSIS — R55 Syncope and collapse: Principal | ICD-10-CM

## 2012-12-02 DIAGNOSIS — Z992 Dependence on renal dialysis: Secondary | ICD-10-CM | POA: Insufficient documentation

## 2012-12-02 LAB — COMPREHENSIVE METABOLIC PANEL
ALT: 11 U/L (ref 0–35)
AST: 19 U/L (ref 0–37)
Albumin: 3.4 g/dL — ABNORMAL LOW (ref 3.5–5.2)
CO2: 29 mEq/L (ref 19–32)
Calcium: 8.3 mg/dL — ABNORMAL LOW (ref 8.4–10.5)
GFR calc non Af Amer: 5 mL/min — ABNORMAL LOW (ref >90)
Sodium: 141 mEq/L (ref 135–145)
Total Protein: 6 g/dL (ref 6.0–8.3)

## 2012-12-02 LAB — CBC
MCH: 32.2 pg (ref 26.0–34.0)
Platelets: 123 10*3/uL — ABNORMAL LOW (ref 150–400)
RBC: 2.45 MIL/uL — ABNORMAL LOW (ref 3.87–5.11)
RDW: 15.3 % (ref 11.5–15.5)

## 2012-12-02 LAB — TYPE AND SCREEN
ABO/RH(D): O NEG
Antibody Screen: NEGATIVE

## 2012-12-02 LAB — TROPONIN I: Troponin I: <0.3 ng/mL (ref <0.30)

## 2012-12-02 MED ORDER — SODIUM CHLORIDE 0.9 % IV SOLN
Freq: Once | INTRAVENOUS | Status: AC
  Administered 2012-12-02: 09:00:00 via INTRAVENOUS

## 2012-12-02 MED ORDER — ONDANSETRON HCL 4 MG/2ML IJ SOLN
4.0000 mg | Freq: Once | INTRAMUSCULAR | Status: AC
  Administered 2012-12-02: 4 mg via INTRAVENOUS
  Filled 2012-12-02: qty 2

## 2012-12-02 NOTE — ED Notes (Signed)
Patient brought in via EMS. Airway patent. Alert and oriented. Patient brought in after syncopal episode while receiving dialysis. Per EMS patient was 2 hours in to a 3 hour dialysis treatment. Per EMS patient syncopal episode "no longer then a minute."Patient c/o weakness and nausea.

## 2012-12-02 NOTE — ED Notes (Signed)
Dialysis nurse flushed access in right arm and left area accessed.

## 2012-12-02 NOTE — ED Provider Notes (Addendum)
History    This chart was scribed for Julia Jakes, MD by Marlyne Beards, ED Scribe. The patient was seen in room [facility]. Patient's care was started at 9:31 AM.    CSN: [id number]  Arrival date & time 12/02/12  0858   First MD Initiated Contact with Patient 12/02/12 [phone]      Chief Complaint  Patient presents with  . Loss of Consciousness  . Weakness  . Nausea    (Consider location/radiation/quality/duration/timing/severity/associated sxs/prior treatment) Patient is a 79 y.o. female presenting with syncope and weakness. The history is provided by the patient and a relative. No language interpreter was used.  Loss of Consciousness  This is a new problem. The current episode started 1 to 2 hours ago. Associated symptoms include nausea, vomiting and weakness. Pertinent negatives include abdominal pain, back pain, chest pain, fever and headaches.  Weakness Pertinent negatives include no chest pain, no abdominal pain, no headaches and no shortness of breath.   Julia Love is a 79 y.o. female brought in by EMS who presents to the Emergency Department complaining of an episode of syncope onset this morning. Pt was 2 hours in to a 3 hour dialysis treatment when pt passed out no longer than a minute EMS reported. Tubes are intact in the ED currently where they were accessing her graft in her right forearm for dialysis treatment. Pt states she was feeling fine before dialysis treatment today. She states before her episode of syncope she was getting hot and her vision started to go blurry. Pt was inclined in hospital bed when incident occurred. Pt denies ever passing out before. Pt complains of associated nausea and weakness. Pt denies fever, chills, cough, diarrhea, SOB, and any other associated symptoms. Relative states that pt has been receiving dialysis treatments for 8 years.Dr. Hart Rochester is pt's vascular surgeon.   Past Medical History  Diagnosis Date  . S/P tonsillectomy     1945   . History of nephrectomy     (right) for a non-functioning kidney in 1949  . S/P complete hysterectomy     1990  . Hypertension     for several years  . Fistula     rt arm for dialysis which is not in use at present  . Hemodialysis patient     since November 2005  . Gastric ulcer     back in 1989  . Parathyroid disorder     surgery in August 2011  . Rectocele     repair several years ago  . GERD (gastroesophageal reflux disease)   . S/P laparoscopic cholecystectomy   . Cancer     Colon cancer  . ESRD (end stage renal disease) on dialysis   . Shortness of breath     with exertion  . Heart murmur     per Dr Daleen Squibb  . H/O hiatal hernia   . Gout     Past Surgical History  Procedure Laterality Date  . Abdominal hysterectomy  1990  . Colonoscopy  10/20/2011    Procedure: COLONOSCOPY;  Surgeon: Malissa Hippo, MD;  [facility];  Service: Endoscopy;  Laterality: N/A;  100  . Esophagogastroduodenoscopy  10/20/2011    Procedure: ESOPHAGOGASTRODUODENOSCOPY (EGD);  Surgeon: Malissa Hippo, MD;  [facility];  Service: Endoscopy;  Laterality: N/A;  Possible EGD  . Laparoscopic nephrectomy  1949  . Eye surgery  2009, 2005    bilateral  . Dg av dialysis graft declot or    .  Cholecystectomy  08  . Parathyroidectomy  07/07/09  . Rectocele repair    . Tonsillectomy    . Colonoscopy  11/05/2011    Procedure: COLONOSCOPY;  Surgeon: Malissa Hippo, MD;  [facility];  Service: Endoscopy;  Laterality: N/A;  730  . Thrombectomy and revision of arterioventous (av) goretex  graft Right 11/23/2012    Procedure: THROMBECTOMY AND REVISION OF RIGHT ARM ARTERIOVENTOUS   GRAFT USING X 10CM THIN WALL STRETCH GORETEX GRAFT.;  Surgeon: Fransisco Hertz, MD;  Location: [facility];  Service: Vascular;  Laterality: Right;    Family History  Problem Relation Age of Onset  . Hypertension Mother   . Stroke Mother   . Hypertension Father   . Stroke Father   . Hypertension  Daughter   . Hypertension Daughter   . Healthy Son   . Colon cancer Neg Hx     History  Substance Use Topics  . Smoking status: Former Smoker -- 1.00 packs/day for 15 years    Types: Cigarettes    Quit date: 07/11/1986  . Smokeless tobacco: Never Used     Comment: Patient smoked 1 pack a day  . Alcohol Use: No    OB History   Grav Para Term Preterm Abortions TAB SAB Ect Mult Living   3 3 3       3       Review of Systems  Constitutional: Positive for fatigue. Negative for fever and chills.  HENT: Negative for sore throat and neck pain.   Eyes: Negative for visual disturbance.  Respiratory: Negative for cough and shortness of breath.   Cardiovascular: Positive for syncope. Negative for chest pain.  Gastrointestinal: Positive for nausea and vomiting. Negative for abdominal pain and diarrhea.  Genitourinary: Negative for dysuria and hematuria.  Musculoskeletal: Negative for back pain.  Skin: Negative for rash.  Neurological: Positive for weakness. Negative for headaches.  Hematological: Bruises/bleeds easily.    Allergies  Vicodin and Contrast media  Home Medications   Current Outpatient Rx  Name  Route  Sig  Dispense  Refill  . acetaminophen (TYLENOL) 500 MG tablet   Oral   Take 500 mg by mouth every 6 (six) hours as needed for pain.         Marland Kitchen allopurinol (ZYLOPRIM) 100 MG tablet   Oral   Take 100 mg by mouth daily.          Marland Kitchen amLODipine (NORVASC) 5 MG tablet   Oral   Take 5 mg by mouth at bedtime.          Marland Kitchen aspirin EC 81 MG tablet   Oral   Take 81 mg by mouth daily.         . cinacalcet (SENSIPAR) 60 MG tablet   Oral   Take 60 mg by mouth at bedtime.         Marland Kitchen lanthanum (FOSRENOL) 1000 MG chewable tablet   Oral   Chew 1,000 mg by mouth 2 (two) times daily with a meal. With snacks         . lisinopril (PRINIVIL,ZESTRIL) 20 MG tablet   Oral   Take 40 mg by mouth at bedtime.          . Multiple Vitamins-Minerals (DIALYVITE 800/ULTRA D  PO)   Oral   Take 1 tablet by mouth daily.          Marland Kitchen omeprazole (PRILOSEC) 20 MG capsule   Oral   Take 20 mg by  mouth 2 (two) times daily.          Marland Kitchen oxyCODONE (ROXICODONE) 5 MG immediate release tablet   Oral   Take 1-2 tablets (5-10 mg total) by mouth every 4 (four) hours as needed for pain.   30 tablet   0   . FIBER FORMULA PO   Oral   Take 30 mLs by mouth daily as needed (for constipation).            BP 126/49  Pulse 79  Resp 18  SpO2 94%  Physical Exam  Nursing note and vitals reviewed. Constitutional: She is oriented to person, place, and time. She appears well-developed and well-nourished. No distress.  HENT:  Head: Normocephalic and atraumatic.  Mouth/Throat: Oropharynx is clear and moist. No oropharyngeal exudate.  Eyes: Conjunctivae and EOM are normal. Pupils are equal, round, and reactive to light. No scleral icterus.  Neck: Normal range of motion. Neck supple. No tracheal deviation present.  Cardiovascular: Normal rate, regular rhythm and normal heart sounds.   No murmur heard. Pt's radial pulse is 1+  Pulmonary/Chest: Effort normal and breath sounds normal. No respiratory distress. She has no rales.  Abdominal: Soft. Bowel sounds are normal. There is no tenderness. There is no guarding.  Musculoskeletal: Normal range of motion. She exhibits no edema.  Lymphadenopathy:    She has no cervical adenopathy.  Neurological: She is alert and oriented to person, place, and time. No cranial nerve deficit. She exhibits normal muscle tone. Coordination normal.  Skin: Skin is warm and dry.  Bruising around the surgical site on the right arm. A anti cubital wound on her right arm.  Psychiatric: She has a normal mood and affect. Her behavior is normal.    ED Course  Procedures (including critical care time) DIAGNOSTIC STUDIES:   COORDINATION OF CARE: 9:59 AM Discussed ED treatment with pt and pt agrees.     Labs Reviewed  CBC - Abnormal; Notable for the  following:    RBC 2.45 (*)    Hemoglobin 7.9 (*)    HCT 24.2 (*)    Platelets 123 (*)    All other components within normal limits  COMPREHENSIVE METABOLIC PANEL - Abnormal; Notable for the following:    Potassium 3.2 (*)    Glucose, Bld 101 (*)    Creatinine, Ser 6.42 (*)    Calcium 8.3 (*)    Albumin 3.4 (*)    Alkaline Phosphatase 175 (*)    GFR calc non Af Amer 5 (*)    GFR calc Af Amer 6 (*)    All other components within normal limits  GLUCOSE, CAPILLARY  TROPONIN I  TYPE AND SCREEN   Dg Chest Port 1 View  12/02/2012  *RADIOLOGY REPORT*  Clinical Data: Weakness, nausea, loss of consciousness  PORTABLE CHEST - 1 VIEW  Comparison: 11/23/2012  Findings: Pain cardiomediastinal silhouette is stable.  Marked elevation of the right hemidiaphragm again noted.  No acute infiltrate or pulmonary edema.  Bony thorax is stable.  IMPRESSION: No active disease.  No significant change.   Original Report Authenticated By: Natasha Mead, M.D.      Date: 12/02/2012  Rate: 80  Rhythm: normal sinus rhythm, sinus arrhythmia and premature ventricular contractions (PVC)  QRS Axis: normal  Intervals: normal  ST/T Wave abnormalities: normal  Conduction Disutrbances:none  Narrative Interpretation:   Old EKG Reviewed: unchanged No change in EKG compared to 05/02/2009  Results for orders placed during the hospital encounter of 12/02/12  GLUCOSE,  CAPILLARY      Result Value Range   Glucose-Capillary 99  70 - 99 mg/dL  CBC      Result Value Range   WBC 6.1  4.0 - 10.5 K/uL   RBC 2.45 (*) 3.87 - 5.11 MIL/uL   Hemoglobin 7.9 (*) 12.0 - 15.0 g/dL   HCT 16.1 (*) 09.6 - 04.5 %   MCV 98.8  78.0 - 100.0 fL   MCH 32.2  26.0 - 34.0 pg   MCHC 32.6  30.0 - 36.0 g/dL   RDW 40.9  81.1 - 91.4 %   Platelets 123 (*) 150 - 400 K/uL  COMPREHENSIVE METABOLIC PANEL      Result Value Range   Sodium 141  135 - 145 mEq/L   Potassium 3.2 (*) 3.5 - 5.1 mEq/L   Chloride 96  96 - 112 mEq/L   CO2 29  19 - 32 mEq/L    Glucose, Bld 101 (*) 70 - 99 mg/dL   BUN 19  6 - 23 mg/dL   Creatinine, Ser 7.82 (*) 0.50 - 1.10 mg/dL   Calcium 8.3 (*) 8.4 - 10.5 mg/dL   Total Protein 6.0  6.0 - 8.3 g/dL   Albumin 3.4 (*) 3.5 - 5.2 g/dL   AST 19  0 - 37 U/L   ALT 11  0 - 35 U/L   Alkaline Phosphatase 175 (*) 39 - 117 U/L   Total Bilirubin 0.4  0.3 - 1.2 mg/dL   GFR calc non Af Amer 5 (*) >90 mL/min   GFR calc Af Amer 6 (*) >90 mL/min  TROPONIN I      Result Value Range   Troponin I <0.30  <0.30 ng/mL       1. Syncope       MDM  Patient status post syncopal event during dialysis may be vasovagal but patient felt fine prior to that has continued to feel ""bad. Which seems to be some nausea some vomiting no chest pain weakness and fatigue. Discussed with hospitalist we will omit to telemetry status for overnight observation troponin rule out and telemetry to rule out any arrhythmia. Patient's dialysis today even though cut in our short seems to be sufficient based on her labs and chest x-ray with no immediate need for additional dialysis today.      I personally performed the services described in this documentation, which was scribed in my presence. The recorded information has been reviewed and is accurate.  Addendum: A patient with slightly for admission. Hospitals in and I have agreed to admit her. Patient was resistant to the admission. We decided would repeat her troponin feeder which she was able to tolerate food without vomiting and walk around without any symptoms which she was able to do. She's had 2 negative troponins. Feels fine now no further complaints feels much better family members will stay with her at home. Patient will be discharged home. The hospitalist is in agreement with this plan.          Julia Jakes, MD 12/02/12 1148   Julia Jakes, MD 12/02/12 [phone]

## 2012-12-02 NOTE — ED Notes (Signed)
After talking with MD the patient requested not to be admitted. MD told patient if she is able to eat meal without getting sick and ambulate with assistance without difficulty she may go home.

## 2012-12-02 NOTE — Progress Notes (Addendum)
Pulled dialysis needles at 1145 per instruction  ER physician both bled x 5 minutes, no bleeding or hematoma. Dressing dry and intact. Had been assured all labs had been collected before needles pulled. At 1200 lab in to draw type and cross, stuck fistula with 23 gauge butterfly no bleeding dressing applied. Patient was very poor veins in non access arm. Patient is to eat ambulate then if feels well is to go home. Report to [employer] .

## 2012-12-02 NOTE — ED Notes (Signed)
Pt was able to ambulate in hallway with steady with assistance.

## 2012-12-02 NOTE — Consult Note (Signed)
History and Physical  Julia Love ZOX:[id number] DOB: 10-Dec-1928 DOA: 12/02/2012  Requesting physician: Shelda Jakes, MD  PCP: Harlow Asa, MD  Cardiologist: Gaylord Shih, MD  Chief Complaint: Verna Czech out  HPI:  79 year old woman presented from outpatient hemodialysis today with syncope, nausea, vomiting and malaise. Consultation requested for further recommendations in regard to syncope.  Chart reviewed in detail and summarized below with recent events. History obtained at bedside from patient, daughter and son-in-law. Patient is a good historian. Over the last 4 weeks she has had problems with her right arm graft and required declot procedures. She missed dialysis 3/20 secondary to a clotted graft. She underwent surgical thrombectomy 3/13 and repeat thrombectomy and revision 3/21. 3/21 was not a good day for her, she felt poorly and ate little. This morning she ate little as is her custom and went to hemodialysis. A longer session was planned for today as she had missed dialysis previously. Approximately 2 hours and she felt "hot", developed visual changes and felt like she was going to pass out. Which he subsequently awoke she was nauseous and had vomited. Afterward she felt fatigued and washed out. She denies any chest pain, shortness of breath, neurologic deficits. She usually is fatigued after dialysis and will nap on those days.  She lives alone but her daughter lives next to her.  In ED  Afebrile, VSS  K+ 3.2, troponin negative, Hgb 7.9, glucose 101  EKG SR, no acute changes  CXR NAD  Chart Review:  3/21 THROMBECTOMY AND REVISION OF ARTERIOVENTOUS (AV) GORETEX GRAFT Right forearm with exploration of venous end and shortening of the graft   3/13 Thrombectomy and revision of right forearm arteriovenous   3/7 recurrent thrombosis of right forearm dialysis graft following recent declot procedure performed on 2/23. Underwent Technically successful declot of right forearm  dialysis graft.  09/2012 cardiology office visit "No significant change since last visit and 2010. Stress Myoview unremarkable at that time. She is tolerating dialysis well. No current recommendations. Return when necessary"  Echocardiogram 12/2008: Normal EF, no WMA  Review of Systems:  Negative for fever, visual changes, sore throat, rash, new muscle aches, chest pain, SOB, dysuria, bleeding, n/v/abdominal pain.  Past Medical History  Diagnosis Date  . S/P tonsillectomy     1945  . History of nephrectomy     (right) for a non-functioning kidney in 1949  . S/P complete hysterectomy     1990  . Hypertension     for several years  . Fistula     rt arm for dialysis which is not in use at present  . Hemodialysis patient     since November 2005  . Gastric ulcer     back in 1989  . Parathyroid disorder     surgery in August 2011  . Rectocele     repair several years ago  . GERD (gastroesophageal reflux disease)   . S/P laparoscopic cholecystectomy   . Cancer     Colon cancer  . ESRD (end stage renal disease) on dialysis   . Shortness of breath     with exertion  . Heart murmur     per Dr Daleen Squibb  . H/O hiatal hernia   . Gout     Past Surgical History  Procedure Laterality Date  . Abdominal hysterectomy  1990  . Colonoscopy  10/20/2011    Procedure: COLONOSCOPY;  Surgeon: Malissa Hippo, MD;  Location: [facility];  Service: Endoscopy;  Laterality: N/A;  100  . Esophagogastroduodenoscopy  10/20/2011    Procedure: ESOPHAGOGASTRODUODENOSCOPY (EGD);  Surgeon: Malissa Hippo, MD;  Location: [facility];  Service: Endoscopy;  Laterality: N/A;  Possible EGD  . Laparoscopic nephrectomy  1949  . Eye surgery  2009, 2005    bilateral  . Dg av dialysis graft declot or    . Cholecystectomy  08  . Parathyroidectomy  07/07/09  . Rectocele repair    . Tonsillectomy    . Colonoscopy  11/05/2011    Procedure: COLONOSCOPY;  Surgeon: Malissa Hippo, MD;  Location: [facility];   Service: Endoscopy;  Laterality: N/A;  730  . Thrombectomy and revision of arterioventous (av) goretex  graft Right 11/23/2012    Procedure: THROMBECTOMY AND REVISION OF RIGHT ARM ARTERIOVENTOUS   GRAFT USING X 10CM THIN WALL STRETCH GORETEX GRAFT.;  Surgeon: Fransisco Hertz, MD;  Location: [facility];  Service: Vascular;  Laterality: Right;    Social History:  reports that she quit smoking about 26 years ago. Her smoking use included Cigarettes. She has a 15 pack-year smoking history. She has never used smokeless tobacco. She reports that she does not drink alcohol or use illicit drugs.  Allergies  Allergen Reactions  . Vicodin (Hydrocodone-Acetaminophen) Other (See Comments)    Makes sleep for long periods of time  . Contrast Media (Iodinated Diagnostic Agents) Itching    Family History  Problem Relation Age of Onset  . Hypertension Mother   . Stroke Mother   . Hypertension Father   . Stroke Father   . Hypertension Daughter   . Hypertension Daughter   . Healthy Son   . Colon cancer Neg Hx      Prior to Admission medications   Medication Sig Start Date End Date Taking? Authorizing Provider  acetaminophen (TYLENOL) 500 MG tablet Take 500 mg by mouth every 6 (six) hours as needed for pain.   Yes Historical Provider, MD  allopurinol (ZYLOPRIM) 100 MG tablet Take 100 mg by mouth daily.    Yes Historical Provider, MD  amLODipine (NORVASC) 5 MG tablet Take 5 mg by mouth at bedtime.    Yes Historical Provider, MD  aspirin EC 81 MG tablet Take 81 mg by mouth daily.   Yes Historical Provider, MD  cinacalcet (SENSIPAR) 60 MG tablet Take 60 mg by mouth at bedtime.   Yes Historical Provider, MD  lanthanum (FOSRENOL) 1000 MG chewable tablet Chew 1,000 mg by mouth 2 (two) times daily with a meal. With snacks   Yes Historical Provider, MD  lisinopril (PRINIVIL,ZESTRIL) 20 MG tablet Take 40 mg by mouth at bedtime.    Yes Historical Provider, MD  Multiple Vitamins-Minerals (DIALYVITE 800/ULTRA D PO)  Take 1 tablet by mouth daily.    Yes Historical Provider, MD  omeprazole (PRILOSEC) 20 MG capsule Take 20 mg by mouth 2 (two) times daily.    Yes Historical Provider, MD  oxyCODONE (ROXICODONE) 5 MG immediate release tablet Take 1-2 tablets (5-10 mg total) by mouth every 4 (four) hours as needed for pain. 12/01/12  Yes Regina J Roczniak, PA-C  FIBER FORMULA PO Take 30 mLs by mouth daily as needed (for constipation).     Historical Provider, MD   Physical Exam: Filed Vitals:   12/02/12 0945 12/02/12 1035 12/02/12 1123  BP: 131/45 127/45 126/49  Pulse: 76 72 79  Resp: 16 18 18   SpO2: 94% 94% 94%    General:  Appears calm and comfortable Eyes: PERRL, normal lids, irises.  Wears glasses. ENT: grossly normal hearing, lips & tongue Neck: no LAD, masses or thyromegaly Cardiovascular: RRR, no m/r/g. No LE edema. Respiratory: CTA bilaterally, no w/r/r. Normal respiratory effort. Abdomen: soft, ntnd Skin: Appears grossly unremarkable. Musculoskeletal: grossly normal tone and strength BUE/BLE Psychiatric: grossly normal mood and affect, speech fluent and appropriate. Follows commands appropriately. Neurologic: Cranial nerves 2-12 appear intact.  Wt Readings from Last 3 Encounters:  12/01/12 71.725 kg (158 lb 2 oz)  12/01/12 71.725 kg (158 lb 2 oz)  11/23/12 69.2 kg (152 lb 8.9 oz)    Labs on Admission:  Basic Metabolic Panel:  Recent Labs Lab 12/01/12 0704 12/02/12 0939  NA 141 141  K 4.2 3.2*  CL  --  96  CO2  --  29  GLUCOSE 89 101*  BUN  --  19  CREATININE  --  6.42*  CALCIUM  --  8.3*    Liver Function Tests:  Recent Labs Lab 12/02/12 0939  AST 19  ALT 11  ALKPHOS 175*  BILITOT 0.4  PROT 6.0  ALBUMIN 3.4*    CBC:  Recent Labs Lab 12/01/12 0704 12/02/12 0939  WBC  --  6.1  HGB 8.5* 7.9*  HCT 25.0* 24.2*  MCV  --  98.8  PLT  --  123*    Cardiac Enzymes:  Recent Labs Lab 12/02/12 0939  TROPONINI <0.30    CBG:  Recent Labs Lab 12/02/12 0906   GLUCAP 99     Radiological Exams on Admission: Dg Chest Port 1 View  12/02/2012  *RADIOLOGY REPORT*  Clinical Data: Weakness, nausea, loss of consciousness  PORTABLE CHEST - 1 VIEW  Comparison: 11/23/2012  Findings: Pain cardiomediastinal silhouette is stable.  Marked elevation of the right hemidiaphragm again noted.  No acute infiltrate or pulmonary edema.  Bony thorax is stable.  IMPRESSION: No active disease.  No significant change.   Original Report Authenticated By: Natasha Mead, M.D.     EKG: Independently reviewed. As above   Principal Problem:   Syncope Active Problems:   Anemia   End stage renal disease   Impression/recommendations: 1. Syncope: With associated prodromal symptoms as well as postevent fatigue and "washed out feeling". No history of chest pain, shortness of breath, focal neurologic deficit or symptoms. No history of cardiac disease or evidence of arrhythmia. Taken together most suggestive of vasovagal syncope. 2. End-stage renal disease: Appears euvolemic. No evidence of edema on x-ray or clinically. Should be stable for next dialysis session 3/25. 3. Acute on chronic normocytic anemia: Presumably anemia of chronic disease. Complicated by recent surgery. No history of bleeding. Appears asymptomatic, was able to ambulate in the emergency department without dizziness. In 8 years she has been on dialysis she has never required transfusion. This can be followed up in the outpatient setting. 4. Minimal hypokalemia: This is asymptomatic. Treated with orange juice in ED. She is not on potassium supplementation at home. Suspect will spontaneously resolve as her appetite has improved.  Comment: Long discussion with patient and family at bedside; workup reviewed and history discussed extensively. Patient has subsequently tolerated lunch in the emergency department without nausea or vomiting and ambulated with her cane and minimal assistance (per RN no gait problems). She has no  cardiac history of note and no symptoms or signs to suggest a cardiogenic etiology. She continues to feel tired, but notes that she is not as tired after dialysis. She does live alone however her daughter lives next door to her is available to watch  her closely. We discussed the likely etiology was vasovagal. The patient was offered observation but would like to go home and her family is in agreement with this. Case was discussed with Dr. Deretha Emory who plans on rechecking troponin and if negative discharge home. I am available to assist if her condition changes prior to discharge.   Code Status: DO NOT RESUSCITATE Family Communication: Discussed with daughter and son-in-law bedside.  Time spent: 60 minutes  Brendia Sacks, MD  [facility] Pager [phone] 12/02/2012, 11:24 AM

## 2012-12-02 NOTE — ED Notes (Signed)
The patient states that she was at her dialysis treatment and started feeling very weak and her vision started to blur and she remembers waking up and thought she must have passed out.  Dialysis access remains present in right upper arm, dialysis center aware and will come to ER to discontinue.  The patient states that this has never happened to her before.  States she recently had surgery to her access in her right upper arm (yesterday), states that she has been having pain in the area since.

## 2012-12-04 ENCOUNTER — Encounter (HOSPITAL_COMMUNITY): Payer: Self-pay | Admitting: Vascular Surgery

## 2012-12-06 ENCOUNTER — Encounter (HOSPITAL_COMMUNITY): Payer: Medicare Other | Attending: Nephrology

## 2012-12-06 DIAGNOSIS — D649 Anemia, unspecified: Secondary | ICD-10-CM

## 2012-12-06 MED ORDER — DIPHENHYDRAMINE HCL 25 MG PO CAPS: 25.0000 mg | ORAL_CAPSULE | Freq: Once | ORAL | Status: DC

## 2012-12-06 MED ORDER — ACETAMINOPHEN 325 MG PO TABS: 325.0000 mg | ORAL_TABLET | Freq: Once | ORAL | Status: DC

## 2012-12-06 NOTE — Progress Notes (Signed)
Julia Love presented for labwork. Labs per MD order drawn via Peripheral Line 23 gauge needle inserted in left antecubital.  Good blood return present. Procedure without incident.  Needle removed intact. Patient tolerated procedure well.

## 2012-12-08 ENCOUNTER — Telehealth (INDEPENDENT_AMBULATORY_CARE_PROVIDER_SITE_OTHER): Payer: Self-pay | Admitting: *Deleted

## 2012-12-08 ENCOUNTER — Encounter: Payer: Self-pay | Admitting: *Deleted

## 2012-12-08 ENCOUNTER — Encounter (HOSPITAL_COMMUNITY): Payer: Medicare Other

## 2012-12-08 VITALS — BP 119/56 | HR 84 | Temp 99.0°F | Resp 18

## 2012-12-08 DIAGNOSIS — N186 End stage renal disease: Secondary | ICD-10-CM

## 2012-12-08 DIAGNOSIS — K7460 Unspecified cirrhosis of liver: Secondary | ICD-10-CM

## 2012-12-08 DIAGNOSIS — D649 Anemia, unspecified: Secondary | ICD-10-CM

## 2012-12-08 DIAGNOSIS — R978 Other abnormal tumor markers: Secondary | ICD-10-CM

## 2012-12-08 MED ORDER — SODIUM CHLORIDE 0.9 % IV SOLN: 250.0000 mL | Freq: Once | INTRAVENOUS | Status: DC

## 2012-12-08 MED ORDER — SODIUM CHLORIDE 0.9 % IJ SOLN: 10.0000 mL | INTRAMUSCULAR | Status: DC | PRN

## 2012-12-08 NOTE — Telephone Encounter (Signed)
Earley Abide called and ask to please give Tammy the fax number to the Dialysis Center for her mother to have lab work. The fax number is [phone]. Advised Earley Abide that Tammy may not get to this until the middle of next week. Voice understood then said that was fine that we all had plenty of time until the apt date.

## 2012-12-08 NOTE — Progress Notes (Signed)
Patient states she took tylenol and benadryl this am. Tolerated transfusion well. Discharged via w/c

## 2012-12-08 NOTE — Telephone Encounter (Signed)
This has been done.

## 2012-12-08 NOTE — Telephone Encounter (Signed)
Labs printed and faxed to Dialysis Center

## 2012-12-09 LAB — TYPE AND SCREEN
ABO/RH(D): O NEG
Antibody Screen: NEGATIVE

## 2012-12-16 LAB — COMPREHENSIVE METABOLIC PANEL
ALT: 11 U/L (ref 0–35)
AST: 15 U/L (ref 0–37)
Albumin: 3.5 g/dL (ref 3.5–5.2)
CO2: 28 mEq/L (ref 19–32)
Calcium: 7.7 mg/dL — ABNORMAL LOW (ref 8.4–10.5)
Chloride: 97 mEq/L (ref 96–112)
Creat: 8.91 mg/dL — ABNORMAL HIGH (ref 0.50–1.10)
Potassium: 3.8 mEq/L (ref 3.5–5.3)

## 2012-12-16 LAB — CBC WITH DIFFERENTIAL/PLATELET
Basophils Absolute: 0 10*3/uL (ref 0.0–0.1)
Eosinophils Relative: 4 % (ref 0–5)
HCT: 32.4 % — ABNORMAL LOW (ref 36.0–46.0)
Lymphocytes Relative: 25 % (ref 12–46)
Lymphs Abs: 1.8 10*3/uL (ref 0.7–4.0)
MCV: 98.8 fL (ref 78.0–100.0)
Monocytes Absolute: 0.6 10*3/uL (ref 0.1–1.0)
Neutro Abs: 4.4 10*3/uL (ref 1.7–7.7)
Platelets: 180 10*3/uL (ref 150–400)
RBC: 3.28 MIL/uL — ABNORMAL LOW (ref 3.87–5.11)
WBC: 7.1 10*3/uL (ref 4.0–10.5)

## 2012-12-16 LAB — CEA: CEA: 4.6 ng/mL (ref 0.0–5.0)

## 2012-12-17 ENCOUNTER — Telehealth: Payer: Self-pay | Admitting: Internal Medicine

## 2012-12-17 NOTE — Telephone Encounter (Signed)
[facility] lab called regarding panic value creatinine  8.91. Pt with hx of renal failure on dialysis.

## 2012-12-17 NOTE — Telephone Encounter (Signed)
Unnecessary call. Sorry dr. Jena Gauss was bothered.

## 2012-12-22 ENCOUNTER — Ambulatory Visit: Payer: Medicare Other | Admitting: Vascular Surgery

## 2013-01-01 ENCOUNTER — Ambulatory Visit (INDEPENDENT_AMBULATORY_CARE_PROVIDER_SITE_OTHER): Payer: Medicare Other | Admitting: Internal Medicine

## 2013-01-04 ENCOUNTER — Ambulatory Visit (INDEPENDENT_AMBULATORY_CARE_PROVIDER_SITE_OTHER): Payer: Medicare Other | Admitting: Internal Medicine

## 2013-01-04 ENCOUNTER — Encounter (INDEPENDENT_AMBULATORY_CARE_PROVIDER_SITE_OTHER): Payer: Self-pay | Admitting: Internal Medicine

## 2013-01-04 VITALS — BP 124/72 | HR 76 | Temp 98.3°F | Resp 18 | Ht 64.0 in | Wt 157.5 lb

## 2013-01-04 DIAGNOSIS — K7460 Unspecified cirrhosis of liver: Secondary | ICD-10-CM

## 2013-01-04 DIAGNOSIS — C189 Malignant neoplasm of colon, unspecified: Secondary | ICD-10-CM

## 2013-01-04 NOTE — Progress Notes (Signed)
Presenting complaint;  Followup for cirrhosis. 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  Subjective:  Patient is a 79-year-old Caucasian female who is here for scheduled visit. He is accompanied by her daughter. She was last seen in March 2013. Patient states she had 5 rough weeks ago she had problems with her AV graft. She did lose some blood while she had surgery to open it up. She was given 2 units of PRBCs. She was also seen in emergency room for syncope. She says she has good appetite. She denies abdominal pain melena or rectal bleeding. She also denies problems with fluid retention or lower extremity edema.  Current Medications: Current Outpatient Prescriptions  Medication Sig Dispense Refill  . acetaminophen (TYLENOL) 500 MG tablet Take 500 mg by mouth every 6 (six) hours as needed for pain.      Marland Kitchen allopurinol (ZYLOPRIM) 100 MG tablet Take 100 mg by mouth daily.       Marland Kitchen amLODipine (NORVASC) 5 MG tablet Take 5 mg by mouth at bedtime.       Marland Kitchen aspirin EC 81 MG tablet Take 81 mg by mouth daily.      . cinacalcet (SENSIPAR) 60 MG tablet Take 60 mg by mouth at bedtime.      Marland Kitchen FIBER FORMULA PO Take 30 mLs by mouth daily as needed (for constipation).       . Heparin Lock Flush (HEPARIN FLUSH) 10 UNIT/ML injection 10 Units. Patient states that she gets this at the time of her dialysis      . lanthanum (FOSRENOL) 1000 MG chewable tablet Chew 1,000 mg by mouth 2 (two) times daily with a meal. With snacks      . lisinopril (PRINIVIL,ZESTRIL) 20 MG tablet Take 40 mg by mouth at bedtime.       . Multiple Vitamins-Minerals (DIALYVITE 800/ULTRA D PO) Take 1 tablet by mouth daily.       Marland Kitchen omeprazole (PRILOSEC) 20 MG capsule Take 20 mg by mouth 2 (two) times daily.        No current facility-administered medications for this visit.     Objective: Blood pressure 124/72, pulse 76, temperature 98.3 F (36.8 C), temperature source Oral, resp. rate 18, height 5\' 4"  (1.626 m), weight 157 lb 8 oz  (71.442 kg). Patient is alert and does not have asterixis. Conjunctiva is pink. Sclera is nonicteric Oropharyngeal mucosa is normal. No neck masses or thyromegaly noted. Cardiac exam with regular rhythm normal S1 and S2. Systolic murmur at LLSB is unchanged. Lungs are clear to auscultation. Abdomen abdomen is symmetrical soft and nontender without organomegaly or masses. She has AV graft at right forearm.  No LE edema or clubbing noted.  Labs/studies Results: Lab data from 12/08/2012. WBC 7.1, H&H 10.5 and 32.4 and platelet count 180K. Bilirubin oh 0.4, AP 214, AST 15, ALT 11, albumin 3.5. CEA 4.6 which is within normal limits. AFP 1.9     Assessment:  #1. Cryptogenic cirrhosis. She remains with well preserved hepatic function and alpha-fetoprotein is normal. Serum albumin and platelet count is also normal which is reassuring. Her alkaline phosphatase is mildly elevated but this would appear to be secondary to bone disease associated with kidney disease. #2. History of invasive carcinoma in the colonic polyp found on colonoscopy of February 2013. Surgery considered but postponed because of underlying cirrhosis. CEA is normal. Will continue to monitor.    Plan:  Hepatic ultrasound. CEA and AFP in 6 months. Office visit in 6 months.

## 2013-01-04 NOTE — Patient Instructions (Signed)
Physician will contact you with results of ultrasound and completed

## 2013-01-08 ENCOUNTER — Ambulatory Visit (HOSPITAL_COMMUNITY)
Admission: RE | Admit: 2013-01-08 | Discharge: 2013-01-08 | Disposition: A | Discharge status: Home / Self Care | Payer: Medicare Other | Source: Ambulatory Visit | Attending: Internal Medicine | Admitting: Internal Medicine

## 2013-01-08 DIAGNOSIS — K7460 Unspecified cirrhosis of liver: Secondary | ICD-10-CM | POA: Insufficient documentation

## 2013-01-08 DIAGNOSIS — R9389 Abnormal findings on diagnostic imaging of other specified body structures: Secondary | ICD-10-CM | POA: Insufficient documentation

## 2013-01-18 ENCOUNTER — Encounter (INDEPENDENT_AMBULATORY_CARE_PROVIDER_SITE_OTHER): Payer: Self-pay

## 2013-02-07 ENCOUNTER — Encounter: Payer: Self-pay | Admitting: Family Medicine

## 2013-02-07 ENCOUNTER — Ambulatory Visit (INDEPENDENT_AMBULATORY_CARE_PROVIDER_SITE_OTHER): Payer: Medicare Other | Admitting: Family Medicine

## 2013-02-07 VITALS — BP 132/82 | Temp 98.5°F | Wt 157.4 lb

## 2013-02-07 DIAGNOSIS — J209 Acute bronchitis, unspecified: Secondary | ICD-10-CM

## 2013-02-07 DIAGNOSIS — N186 End stage renal disease: Secondary | ICD-10-CM

## 2013-02-07 MED ORDER — AMOXICILLIN 500 MG PO TABS: 500.0000 mg | ORAL_TABLET | Freq: Two times a day (BID) | ORAL | Status: DC

## 2013-02-07 NOTE — Patient Instructions (Signed)
Take all the antibiotics 

## 2013-02-07 NOTE — Progress Notes (Signed)
  Subjective:    Patient ID: Julia Love, female    DOB: [birth year], 79 y.o.   MRN: [id number]  Sinus Problem This is a new problem. The current episode started in the past 7 days. The problem has been gradually worsening since onset. There has been no fever. The fever has been present for less than 1 day. Associated symptoms include congestion, coughing, ear pain and sinus pressure. (No energy) Past treatments include nothing. The treatment provided mild relief.   Minimal appetite, no vom or diarrhea   Review of Systems  HENT: Positive for ear pain, congestion and sinus pressure.   Respiratory: Positive for cough.    Otherwise neg    Objective:   Physical Exam alert Moderate malaise. Vitals reviewed. Afebrile. Lungs clear. Occasional cough during exam. Heart regular rate rhythm. H&T moderate his congestion.      Assessment & Plan:  Impression sinusitis bronchitis in patient with known diabetes and dialysis. Discussed. Plan a mocks 500 twice a day 10 days. Symptomatic care discussed.

## 2013-02-08 ENCOUNTER — Telehealth: Payer: Self-pay | Admitting: *Deleted

## 2013-02-08 NOTE — Telephone Encounter (Signed)
Call to pt to check if received ABT. Pt stated she did receive ABT at [facility] pharmacy for cost of $5.00.

## 2013-02-26 ENCOUNTER — Ambulatory Visit (INDEPENDENT_AMBULATORY_CARE_PROVIDER_SITE_OTHER): Payer: Medicare Other | Admitting: Family Medicine

## 2013-02-26 ENCOUNTER — Encounter: Payer: Self-pay | Admitting: Family Medicine

## 2013-02-26 VITALS — BP 122/72 | Wt 155.2 lb

## 2013-02-26 DIAGNOSIS — A084 Viral intestinal infection, unspecified: Secondary | ICD-10-CM

## 2013-02-26 DIAGNOSIS — A088 Other specified intestinal infections: Secondary | ICD-10-CM

## 2013-02-26 MED ORDER — ONDANSETRON 4 MG PO TBDP: 4.0000 mg | ORAL_TABLET | Freq: Three times a day (TID) | ORAL | Status: DC | PRN

## 2013-02-26 NOTE — Progress Notes (Signed)
  Subjective:    Patient ID: Julia Love, female    DOB: February 23, 1929, 79 y.o.   MRN: [id number]  Diarrhea  This is a new problem. The current episode started in the past 7 days. The problem occurs 2 to 4 times per day. The problem has been gradually improving. The stool consistency is described as watery. Associated symptoms include abdominal pain and vomiting. Associated symptoms comments: Ate suspect food at homecoming at church. Nothing aggravates the symptoms. Risk factors include recent antibiotic use and suspect food intake. She has tried anti-motility drug for the symptoms. The treatment provided mild relief.   Stools still loose, yest, little less watery. No vom past few days   Review of Systems  Gastrointestinal: Positive for vomiting, abdominal pain and diarrhea.   ROS otherwise negative    Objective:   Physical Exam  Alert pleasant talkative. No acute distress. Vitals stable. Lungs clear. Heart regular in rhythm. Abdomen benign.      Assessment & Plan:  Impression gastroenteritis viral versus food borne. Occurred after homecoming meal at church. Many others got sick also they have now recovered. Plan symptomatic care discussed. Zofran when necessary periods recheck her persists expect slow resolution with age plus dialysis. WSL

## 2013-03-27 ENCOUNTER — Telehealth (INDEPENDENT_AMBULATORY_CARE_PROVIDER_SITE_OTHER): Payer: Self-pay | Admitting: *Deleted

## 2013-03-27 NOTE — Telephone Encounter (Signed)
Julia Love, daughter, would like to know what to except in a patient with cirrhosis of the liver. Aliena will only eat cookies, honeybuns, etc. Things she doesn't have to cook or heat in a microwave. She stays nauseated with vomiting and some diarrhea. Not sure if her mother may be giving up because she is now saying she doesn't feel like going to dialysis. Would love to speak with Dr. Karilyn Cota if he could return her call at [phone] or [phone].

## 2013-03-27 NOTE — Telephone Encounter (Signed)
Patient needs gastroparesis diet sheet. Her daughter will come and pick up tomorrow or sometime this week.

## 2013-03-28 ENCOUNTER — Encounter: Payer: Self-pay | Admitting: Family Medicine

## 2013-03-28 ENCOUNTER — Ambulatory Visit (INDEPENDENT_AMBULATORY_CARE_PROVIDER_SITE_OTHER): Payer: Medicare Other | Admitting: Family Medicine

## 2013-03-28 VITALS — BP 168/90 | Temp 98.1°F | Wt 150.0 lb

## 2013-03-28 DIAGNOSIS — H8110 Benign paroxysmal vertigo, unspecified ear: Secondary | ICD-10-CM | POA: Insufficient documentation

## 2013-03-28 NOTE — Progress Notes (Signed)
  Subjective:    Patient ID: Julia Love, female    DOB: December 26, 1928, 79 y.o.   MRN: [id number]  HPI Very dizzy. Nausea, diminished energy. Patient has had concerned about potential for stroke. History of significant dizziness. We had prescribed a Zofran prescription which she did not get filled out due to cost. No loss of consciousness no other focal deficits. Some shakiness. Diminished energy ROS otherwise negative  Stokes pos family history.  Swelling of the feet. Dialysis yesterday. BP has been elevated  Review of Systems    see above Objective:   Physical Exam  alert mild malaise. HEENT normal. Vital stable. Lungs clear. Heart regular in rhythm. Neuro intact. No focal deficits. Cerebellar function intact.       Assessment & Plan:  Impression #1 vertigo discussed at length. #2 hypertension suboptimal in. #3 peripheral edema within normal limits for someone on dialysis discussed. Plan 25 minutes spent most in discussion. We will not change blood pressure medicine at this time. Add Zofran when necessary for nausea. Family brought up forgetfulness in the visit. To schedule appointment to discuss this in a month. WSL

## 2013-03-28 NOTE — Telephone Encounter (Signed)
Diet put up front for Ms' Yust's daughter

## 2013-04-25 ENCOUNTER — Ambulatory Visit: Payer: Medicare Other | Admitting: Family Medicine

## 2013-04-27 ENCOUNTER — Ambulatory Visit: Payer: Medicare Other | Admitting: Cardiology

## 2013-04-30 ENCOUNTER — Encounter (INDEPENDENT_AMBULATORY_CARE_PROVIDER_SITE_OTHER): Payer: Self-pay | Admitting: *Deleted

## 2013-05-01 ENCOUNTER — Encounter: Payer: Self-pay | Admitting: Family Medicine

## 2013-05-01 ENCOUNTER — Ambulatory Visit (INDEPENDENT_AMBULATORY_CARE_PROVIDER_SITE_OTHER): Payer: Medicare Other | Admitting: Family Medicine

## 2013-05-01 VITALS — BP 132/68 | Ht 64.0 in | Wt 149.2 lb

## 2013-05-01 DIAGNOSIS — R5381 Other malaise: Secondary | ICD-10-CM

## 2013-05-01 DIAGNOSIS — F0390 Unspecified dementia without behavioral disturbance: Secondary | ICD-10-CM

## 2013-05-01 DIAGNOSIS — R413 Other amnesia: Secondary | ICD-10-CM

## 2013-05-01 DIAGNOSIS — D539 Nutritional anemia, unspecified: Secondary | ICD-10-CM

## 2013-05-01 NOTE — Progress Notes (Signed)
  Subjective:    Patient ID: Julia Love, female    DOB: 1929/03/29, 79 y.o.   MRN: [id number]  HPI Dizziness off and on for at least a half of a decade.  Hx of vertigo, And aggravated by dizziness.  year1  month1 D 1 [id number] Ser 100s  93,0  w1  Three0  total score on MMSE 25/30    Family concerned about memory and forgetfulness  Difficulty with location and finding directions.  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   Review of Systems  no chest pain no abdominal pain no change in bowel habits no fever no blood in stools chronic fatigue    Objective:   Physical Exam Alert no acute distress. Lungs clear. Heart regular in rhythm. Slight tremor. HEENT normal. No focal deficits  Impression probable early mild dementia. Scores 25/30 on MMSE. Family very concerned. Further workup warranted. Plan recommend scan. Appropriate blood work. 2 early to press on with medication rationale discussed. Easily 35-40 minutes spent most in discussion. WSL       workup. Impression and plan see above

## 2013-05-01 NOTE — Progress Notes (Signed)
  Subjective:    Patient ID: Julia Love, female    DOB: October 21, 1928, 79 y.o.   MRN: [id number]  HPI Patient is here today to discuss possible memory loss.  Patient also states that she is concerned about her dizziness that she has been experiencing for a while now.   Review of Systems     Objective:   Physical Exam        Assessment & Plan:

## 2013-05-03 ENCOUNTER — Other Ambulatory Visit: Payer: Self-pay | Admitting: Family Medicine

## 2013-05-03 DIAGNOSIS — R413 Other amnesia: Secondary | ICD-10-CM | POA: Insufficient documentation

## 2013-05-04 LAB — TSH: TSH: 3.531 u[IU]/mL (ref 0.350–4.500)

## 2013-05-07 ENCOUNTER — Ambulatory Visit (HOSPITAL_COMMUNITY)
Admission: RE | Admit: 2013-05-07 | Discharge: 2013-05-07 | Disposition: A | Discharge status: Home / Self Care | Payer: Medicare Other | Source: Ambulatory Visit | Attending: Family Medicine | Admitting: Family Medicine

## 2013-05-07 DIAGNOSIS — F0390 Unspecified dementia without behavioral disturbance: Secondary | ICD-10-CM | POA: Insufficient documentation

## 2013-05-07 DIAGNOSIS — G319 Degenerative disease of nervous system, unspecified: Secondary | ICD-10-CM | POA: Insufficient documentation

## 2013-05-10 ENCOUNTER — Ambulatory Visit (INDEPENDENT_AMBULATORY_CARE_PROVIDER_SITE_OTHER): Payer: Medicare Other | Admitting: Otolaryngology

## 2013-05-10 DIAGNOSIS — H6120 Impacted cerumen, unspecified ear: Secondary | ICD-10-CM

## 2013-05-10 DIAGNOSIS — H9209 Otalgia, unspecified ear: Secondary | ICD-10-CM

## 2013-05-15 ENCOUNTER — Ambulatory Visit (INDEPENDENT_AMBULATORY_CARE_PROVIDER_SITE_OTHER): Payer: Medicare Other | Admitting: Cardiology

## 2013-05-15 VITALS — BP 171/74 | HR 78 | Ht 64.0 in | Wt 157.0 lb

## 2013-05-15 DIAGNOSIS — Z136 Encounter for screening for cardiovascular disorders: Secondary | ICD-10-CM

## 2013-05-15 DIAGNOSIS — R42 Dizziness and giddiness: Secondary | ICD-10-CM

## 2013-05-15 NOTE — Progress Notes (Signed)
Clinical Summary Julia Love is a 79 y.o.female  1. Lightheadness - From June to late August, trouble walking straight because felt lightheaded. Reports head felt a little heavy w/ sitting, but symptoms were much worst after standing up. Denies any falls. Denies any chest pain or dizziness w/ episodes. Denies any symptoms over the last week, she cannot attribute this change to anything. Occasional DOE, no orthopnea, occas LE edema.    Allergies  Allergen Reactions  . Vicodin [Hydrocodone-Acetaminophen] Other (See Comments)    Makes sleep for long periods of time  . Contrast Media [Iodinated Diagnostic Agents] Itching    Current Outpatient Prescriptions  Medication Sig Dispense Refill  . acetaminophen (TYLENOL) 500 MG tablet Take 500 mg by mouth every 6 (six) hours as needed for pain.      Marland Kitchen allopurinol (ZYLOPRIM) 100 MG tablet Take 100 mg by mouth daily.       Marland Kitchen amLODipine (NORVASC) 5 MG tablet Take 5 mg by mouth at bedtime.       Marland Kitchen aspirin EC 81 MG tablet Take 81 mg by mouth daily.      . cinacalcet (SENSIPAR) 60 MG tablet Take 60 mg by mouth at bedtime.      Marland Kitchen FIBER FORMULA PO Take 30 mLs by mouth daily as needed (for constipation).       . Heparin Lock Flush (HEPARIN FLUSH) 10 UNIT/ML injection 10 Units. Patient states that she gets this at the time of her dialysis      . lanthanum (FOSRENOL) 1000 MG chewable tablet Chew 1,000 mg by mouth 2 (two) times daily with a meal. With snacks      . lisinopril (PRINIVIL,ZESTRIL) 20 MG tablet Take 40 mg by mouth at bedtime.       . Multiple Vitamins-Minerals (DIALYVITE 800/ULTRA D PO) Take 1 tablet by mouth daily.       Marland Kitchen omeprazole (PRILOSEC) 20 MG capsule Take 20 mg by mouth 2 (two) times daily.        No current facility-administered medications for this visit.    Past Medical History  Diagnosis Date  . S/P tonsillectomy     1945  . History of nephrectomy     (right) for a non-functioning kidney in 1949  . S/P complete hysterectomy      1990  . Hypertension     for several years  . Fistula     rt arm for dialysis which is not in use at present  . Hemodialysis patient     since November 2005  . Gastric ulcer     back in 1989  . Parathyroid disorder     surgery in August 2011  . Rectocele     repair several years ago  . GERD (gastroesophageal reflux disease)   . S/P laparoscopic cholecystectomy   . Cancer     Colon cancer  . ESRD (end stage renal disease) on dialysis   . Shortness of breath     with exertion  . Heart murmur     per Dr Daleen Squibb  . H/O hiatal hernia   . Gout      Past Surgical History  Procedure Laterality Date  . Abdominal hysterectomy  1990  . Colonoscopy  10/20/2011    Procedure: COLONOSCOPY;  Surgeon: Malissa Hippo, MD;  Location: [facility];  Service: Endoscopy;  Laterality: N/A;  100  . Esophagogastroduodenoscopy  10/20/2011    Procedure: ESOPHAGOGASTRODUODENOSCOPY (EGD);  Surgeon: Malissa Hippo, MD;  Location:  [facility];  Service: Endoscopy;  Laterality: N/A;  Possible EGD  . Laparoscopic nephrectomy  1949  . Eye surgery  2009, 2005    bilateral  . Dg av dialysis graft declot or    . Cholecystectomy  08  . Parathyroidectomy  07/07/09  . Rectocele repair    . Tonsillectomy    . Colonoscopy  11/05/2011    Procedure: COLONOSCOPY;  Surgeon: Malissa Hippo, MD;  Location: [facility];  Service: Endoscopy;  Laterality: N/A;  730  . Thrombectomy and revision of arterioventous (av) goretex  graft Right 11/23/2012    Procedure: THROMBECTOMY AND REVISION OF RIGHT ARM ARTERIOVENTOUS   GRAFT USING X 10CM THIN WALL STRETCH GORETEX GRAFT.;  Surgeon: Fransisco Hertz, MD;  Location: [facility];  Service: Vascular;  Laterality: Right;  . Thrombectomy and revision of arterioventous (av) goretex  graft Right 12/01/2012    Procedure: THROMBECTOMY AND REVISION OF ARTERIOVENTOUS (AV) GORETEX  GRAFT;  Surgeon: Pryor Ochoa, MD;  Location: [facility] OR;  Service: Vascular;  Laterality: Right;    Family  History  Problem Relation Age of Onset  . Hypertension Mother   . Stroke Mother   . Hypertension Father   . Stroke Father   . Hypertension Daughter   . Hypertension Daughter   . Healthy Son   . Colon cancer Neg Hx     Social History Julia Love reports that she quit smoking about 26 years ago. Her smoking use included Cigarettes. She has a 15 pack-year smoking history. She has never used smokeless tobacco. Julia Love reports that she does not drink alcohol.  Review of Systems Negative other than reported in HPI  Physical Examination Orthostatics: Lying: 178/58 p 73 Sitting 162/58 p 76 Standing 148/60 p 76 p 72 bp 148/69 Gen: NAD CV: RRR, 3/6 early peaking systolic crescendo murmur RUSB radiated to carotids Pulm: CTAB Abd: soft, NT, ND Ext: trace bilateral edema Neuro: A&Ox3, no focal deficits Skin: no rash  EKG: sinus rhythm, sinus arrhythmia  Assessment/Plan  1. Dizziness/vertgio: difficult to obtain history, very mixed history of dizzines vs. vertigo and patient is a limited historian. Prior diagnoses of vertigo and possible BPPV as well w/ prior improvement w/ maneuvers. From my history, she currently describes mainly lightheadedness w/ walking, no significant symptoms w/ lying or sitting.History not conistent w/ arrythmia. With her history of some DOE, heart murmur and dizziness,  could potentially be caused by aortic stenosis. Will order echo, f/u in 1 month. She was hypertensive though orthostatic in clinic as well which could be contributing to her symptoms, she states her bp often is very elevated prior to dialysis, after which its normal Will f/u echo, pending no significant valvular disease will consider assessing bp and dialysis regimens.   Dina Rich MD

## 2013-05-15 NOTE — Patient Instructions (Addendum)
Your physician recommends that you schedule a follow-up appointment in: ONE MONTH  Your physician has requested that you have an echocardiogram. Echocardiography is a painless test that uses sound waves to create images of your heart. It provides your doctor with information about the size and shape of your heart and how well your heart's chambers and valves are working. This procedure takes approximately one hour. There are no restrictions for this procedure.WE WILL CALL YOU WITH THE RESULTS

## 2013-05-23 ENCOUNTER — Ambulatory Visit (HOSPITAL_COMMUNITY)
Admission: RE | Admit: 2013-05-23 | Discharge: 2013-05-23 | Disposition: A | Discharge status: Home / Self Care | Payer: Medicare Other | Source: Ambulatory Visit | Attending: Cardiology | Admitting: Cardiology

## 2013-05-23 DIAGNOSIS — Z136 Encounter for screening for cardiovascular disorders: Secondary | ICD-10-CM

## 2013-05-23 DIAGNOSIS — R011 Cardiac murmur, unspecified: Secondary | ICD-10-CM | POA: Insufficient documentation

## 2013-05-23 DIAGNOSIS — Z992 Dependence on renal dialysis: Secondary | ICD-10-CM | POA: Insufficient documentation

## 2013-05-23 DIAGNOSIS — N186 End stage renal disease: Secondary | ICD-10-CM | POA: Insufficient documentation

## 2013-05-23 DIAGNOSIS — I359 Nonrheumatic aortic valve disorder, unspecified: Secondary | ICD-10-CM

## 2013-05-23 DIAGNOSIS — Z87891 Personal history of nicotine dependence: Secondary | ICD-10-CM | POA: Insufficient documentation

## 2013-05-23 NOTE — Progress Notes (Signed)
*  PRELIMINARY RESULTS* Echocardiogram 2D Echocardiogram has been performed.  Julia Love 05/23/2013, 12:52 PM

## 2013-06-01 ENCOUNTER — Telehealth: Payer: Self-pay | Admitting: Cardiology

## 2013-06-01 NOTE — Telephone Encounter (Signed)
Results of Echo / tgs °

## 2013-06-01 NOTE — Telephone Encounter (Signed)
PLEASE ADVISE.

## 2013-06-04 NOTE — Telephone Encounter (Signed)
.  left message to have patient return my call.  

## 2013-06-04 NOTE — Telephone Encounter (Signed)
Spoke to patient concerning lab/test results/instructions from provider. Patient understood.    

## 2013-06-04 NOTE — Telephone Encounter (Signed)
Please let patient know that her echo showed normal pumping function of the heart. Her heart was a little stiff, but this is a common finding as we age. One of her heart valves was moderately thickened (her aortic valve) but its just something to watch over the next few years. Otherwise no significant findings on her echo

## 2013-06-13 ENCOUNTER — Encounter (INDEPENDENT_AMBULATORY_CARE_PROVIDER_SITE_OTHER): Payer: Self-pay | Admitting: *Deleted

## 2013-06-20 ENCOUNTER — Encounter: Payer: Self-pay | Admitting: Cardiology

## 2013-06-20 ENCOUNTER — Ambulatory Visit (INDEPENDENT_AMBULATORY_CARE_PROVIDER_SITE_OTHER): Payer: Medicare Other | Admitting: Cardiology

## 2013-06-20 VITALS — BP 148/70 | HR 76 | Ht 64.0 in | Wt 146.0 lb

## 2013-06-20 DIAGNOSIS — R42 Dizziness and giddiness: Secondary | ICD-10-CM

## 2013-06-20 NOTE — Patient Instructions (Addendum)
Your physician recommends that you schedule a follow-up appointment in: 3 months.  

## 2013-06-20 NOTE — Progress Notes (Signed)
Clinical Summary Ms. Coley is a 80 y.o.female  1. Lightheadness  - From June to late August, trouble walking straight because felt lightheaded. Reports head felt a little heavy w/ sitting, but symptoms were much worst after standing up. Denies any falls. Denies any chest pain or dizziness w/ episodes. Denies any symptoms over the last week, she cannot attribute this change to anything. Occasional DOE, no orthopnea, occas LE edema  - since last visit symptoms have mostly improved. Reports she can walk without significant dizziness now.   Past Medical History  Diagnosis Date  . S/P tonsillectomy     1945  . History of nephrectomy     (right) for a non-functioning kidney in 1949  . S/P complete hysterectomy     1990  . Hypertension     for several years  . Fistula     rt arm for dialysis which is not in use at present  . Hemodialysis patient     since November 2005  . Gastric ulcer     back in 1989  . Parathyroid disorder     surgery in August 2011  . Rectocele     repair several years ago  . GERD (gastroesophageal reflux disease)   . S/P laparoscopic cholecystectomy   . Cancer     Colon cancer  . ESRD (end stage renal disease) on dialysis   . Shortness of breath     with exertion  . Heart murmur     per Dr Daleen Squibb  . H/O hiatal hernia   . Gout      Allergies  Allergen Reactions  . Vicodin [Hydrocodone-Acetaminophen] Other (See Comments)    Makes sleep for long periods of time  . Contrast Media [Iodinated Diagnostic Agents] Itching     Current Outpatient Prescriptions  Medication Sig Dispense Refill  . acetaminophen (TYLENOL) 500 MG tablet Take 500 mg by mouth every 6 (six) hours as needed for pain.      Marland Kitchen allopurinol (ZYLOPRIM) 100 MG tablet Take 100 mg by mouth daily.       Marland Kitchen amLODipine (NORVASC) 5 MG tablet Take 5 mg by mouth at bedtime.       Marland Kitchen aspirin EC 81 MG tablet Take 81 mg by mouth daily.      . cinacalcet (SENSIPAR) 60 MG tablet Take 60 mg by  mouth at bedtime.      Marland Kitchen FIBER FORMULA PO Take 30 mLs by mouth daily as needed (for constipation).       . Heparin Lock Flush (HEPARIN FLUSH) 10 UNIT/ML injection 10 Units. Patient states that she gets this at the time of her dialysis      . lanthanum (FOSRENOL) 1000 MG chewable tablet Chew 1,000 mg by mouth 2 (two) times daily with a meal. With snacks      . lisinopril (PRINIVIL,ZESTRIL) 20 MG tablet Take 40 mg by mouth at bedtime.       . Multiple Vitamins-Minerals (DIALYVITE 800/ULTRA D PO) Take 1 tablet by mouth daily.       Marland Kitchen omeprazole (PRILOSEC) 20 MG capsule Take 20 mg by mouth 2 (two) times daily.        No current facility-administered medications for this visit.     Past Surgical History  Procedure Laterality Date  . Abdominal hysterectomy  1990  . Colonoscopy  10/20/2011    Procedure: COLONOSCOPY;  Surgeon: Malissa Hippo, MD;  [facility]: [facility];  Service: Endoscopy;  Laterality: N/A;  100  . Esophagogastroduodenoscopy  10/20/2011    Procedure: ESOPHAGOGASTRODUODENOSCOPY (EGD);  Surgeon: Malissa Hippo, MD;  [facility]: [facility];  Service: Endoscopy;  Laterality: N/A;  Possible EGD  . Laparoscopic nephrectomy  1949  . Eye surgery  2009, 2005    bilateral  . Dg av dialysis graft declot or    . Cholecystectomy  08  . Parathyroidectomy  07/07/09  . Rectocele repair    . Tonsillectomy    . Colonoscopy  11/05/2011    Procedure: COLONOSCOPY;  Surgeon: Malissa Hippo, MD;  Location: [facility];  Service: Endoscopy;  Laterality: N/A;  730  . Thrombectomy and revision of arterioventous (av) goretex  graft Right 11/23/2012    Procedure: THROMBECTOMY AND REVISION OF RIGHT ARM ARTERIOVENTOUS   GRAFT USING X 10CM THIN WALL STRETCH GORETEX GRAFT.;  Surgeon: Fransisco Hertz, MD;  Location: [facility];  Service: Vascular;  Laterality: Right;  . Thrombectomy and revision of arterioventous (av) goretex  graft Right 12/01/2012    Procedure: THROMBECTOMY AND REVISION OF ARTERIOVENTOUS  (AV) GORETEX  GRAFT;  Surgeon: Pryor Ochoa, MD;  Location: [facility] OR;  Service: Vascular;  Laterality: Right;     Allergies  Allergen Reactions  . Vicodin [Hydrocodone-Acetaminophen] Other (See Comments)    Makes sleep for long periods of time  . Contrast Media [Iodinated Diagnostic Agents] Itching      Family History  Problem Relation Age of Onset  . Hypertension Mother   . Stroke Mother   . Hypertension Father   . Stroke Father   . Hypertension Daughter   . Hypertension Daughter   . Healthy Son   . Colon cancer Neg Hx      Social History Ms. Mccarter reports that she quit smoking about 26 years ago. Her smoking use included Cigarettes. She has a 15 pack-year smoking history. She has never used smokeless tobacco. Ms. Gadsby reports that she does not drink alcohol.   Review of Systems CONSTITUTIONAL: No weight loss, fever, chills, weakness or fatigue.  HEENT: Eyes: No visual loss, blurred vision, double vision or yellow sclerae.No hearing loss, sneezing, congestion, runny nose or sore throat.  SKIN: No rash or itching.  CARDIOVASCULAR:  RESPIRATORY: No shortness of breath, cough or sputum.  GASTROINTESTINAL: No anorexia, nausea, vomiting or diarrhea. No abdominal pain or blood.  GENITOURINARY: No burning on urination, no polyuria NEUROLOGICAL: No headache, dizziness, syncope, paralysis, ataxia, numbness or tingling in the extremities. No change in bowel or bladder control.  MUSCULOSKELETAL: No muscle, back pain, joint pain or stiffness.  LYMPHATICS: No enlarged nodes. No history of splenectomy.  PSYCHIATRIC: No history of depression or anxiety.  ENDOCRINOLOGIC: No reports of sweating, cold or heat intolerance. No polyuria or polydipsia.  Marland Kitchen   Physical Examination p 76 bp 148/70 Wt 146 lbs BMI 25  Gen: resting comfortably, no acute distress HEENT: no scleral icterus, pupils equal round and reactive, no palptable cervical adenopathy,  CV: 2/6 systolic murmur RUSB mid  peaking radiates to carotids, no JVD Resp: Clear to auscultation bilaterally GI: abdomen is soft, non-tender, non-distended, normal bowel sounds, no hepatosplenomegaly MSK: extremities are warm, no edema.  Skin: warm, no rash Neuro:  no focal deficits Psych: appropriate affect   Diagnostic Studies 05/23/13 Echo: LVEF 55-60%, mild LVH, no WMA, grade I diastolic dysfunction, mild to mod AS,     Assessment and Plan  Assessment/Plan  1. Dizziness/vertgio: difficult to obtain history, very mixed history of dizzines vs. vertigo and patient  is a limited historian. Prior diagnoses of vertigo and possible BPPV as well w/ prior improvement w/ maneuvers.  - echo shows only mild to mod AS, not related to symptoms - last visit despite being hypertensive she was orthostatic, I suspect her symptoms are related to complex fluids shifts associated with her dialysis in the setting of chronic hypertension and HTN medications - symptoms improved since last visit, will continue to follow clinically. No further workup at this time.       Antoine Poche, M.D., F.A.C.C.

## 2013-06-21 ENCOUNTER — Other Ambulatory Visit (INDEPENDENT_AMBULATORY_CARE_PROVIDER_SITE_OTHER): Payer: Self-pay | Admitting: Internal Medicine

## 2013-06-21 ENCOUNTER — Telehealth (INDEPENDENT_AMBULATORY_CARE_PROVIDER_SITE_OTHER): Payer: Self-pay | Admitting: *Deleted

## 2013-06-21 DIAGNOSIS — K769 Liver disease, unspecified: Secondary | ICD-10-CM

## 2013-06-21 NOTE — Telephone Encounter (Signed)
[facility] ws called and given the following Rx for the patient per Dr.[name]. Prednisone 50 mg by mouth at 13 hours ,  50 mg by mouth 7 hours and 50 mg by mouth 1 hour prior to scan. Benadryl 25 mg OTC , patient to take by mouth 1  Hour prior to scan.

## 2013-06-21 NOTE — Telephone Encounter (Signed)
Patient is sch'd for CT 10/15, she is allergic to contrast and per Dr Karilyn Cota she will need to be premedicated --  Prednisone 50 mg PO at 13 hours, 7 hours and 1 hour prior to scan Benadryl 50 mg PO 1 hour prior scan

## 2013-06-27 ENCOUNTER — Ambulatory Visit (HOSPITAL_COMMUNITY)
Admission: RE | Admit: 2013-06-27 | Discharge: 2013-06-27 | Disposition: A | Discharge status: Home / Self Care | Payer: Medicare Other | Source: Ambulatory Visit | Attending: Internal Medicine | Admitting: Internal Medicine

## 2013-06-27 DIAGNOSIS — K7689 Other specified diseases of liver: Secondary | ICD-10-CM | POA: Insufficient documentation

## 2013-06-27 DIAGNOSIS — K769 Liver disease, unspecified: Secondary | ICD-10-CM

## 2013-06-27 DIAGNOSIS — R161 Splenomegaly, not elsewhere classified: Secondary | ICD-10-CM | POA: Insufficient documentation

## 2013-06-27 DIAGNOSIS — K7460 Unspecified cirrhosis of liver: Secondary | ICD-10-CM | POA: Insufficient documentation

## 2013-06-27 MED ORDER — IOHEXOL 300 MG/ML  SOLN
80.0000 mL | Freq: Once | INTRAMUSCULAR | Status: AC | PRN
  Administered 2013-06-27: 80 mL via INTRAVENOUS

## 2013-07-03 ENCOUNTER — Ambulatory Visit (INDEPENDENT_AMBULATORY_CARE_PROVIDER_SITE_OTHER): Payer: Medicare Other | Admitting: Internal Medicine

## 2013-07-03 ENCOUNTER — Encounter (INDEPENDENT_AMBULATORY_CARE_PROVIDER_SITE_OTHER): Payer: Self-pay | Admitting: Internal Medicine

## 2013-07-03 VITALS — BP 130/76 | HR 80 | Temp 98.0°F | Resp 18 | Ht 64.0 in | Wt 145.0 lb

## 2013-07-03 DIAGNOSIS — K769 Liver disease, unspecified: Secondary | ICD-10-CM

## 2013-07-03 DIAGNOSIS — K7689 Other specified diseases of liver: Secondary | ICD-10-CM

## 2013-07-03 DIAGNOSIS — C189 Malignant neoplasm of colon, unspecified: Secondary | ICD-10-CM

## 2013-07-03 DIAGNOSIS — R110 Nausea: Secondary | ICD-10-CM

## 2013-07-03 DIAGNOSIS — K589 Irritable bowel syndrome without diarrhea: Secondary | ICD-10-CM

## 2013-07-03 DIAGNOSIS — K7460 Unspecified cirrhosis of liver: Secondary | ICD-10-CM

## 2013-07-03 DIAGNOSIS — R634 Abnormal weight loss: Secondary | ICD-10-CM

## 2013-07-03 MED ORDER — ONDANSETRON HCL 4 MG PO TABS: 4.0000 mg | ORAL_TABLET | Freq: Two times a day (BID) | ORAL | Status: DC | PRN

## 2013-07-03 NOTE — Progress Notes (Signed)
Presenting complaint;  Followup for nausea vomiting cirrhosis and history of colon carcinoma(invasive carcinoma in a polyp).  Subjective:  Patient is a 79-year-old Caucasian female with her first scheduled visit accompanied by her daughter Earley Abide. She was last seen 6 months ago. She is feeling better. She finished physical therapy recently and is able to move around much better. She is using a walker for ambulation. She lives alone but she does have help most evenings and her daughters stay in touch with her regularly. She hasn't had any episodes of vomiting recently but she continues to experience nausea intermittently. Last episode occurred when she ate sausage biscuit. She does not have a good appetite. She has lost 12 pounds since her last visit. She remains with irregular bowel movements. She has diarrhea constipation and at times normal stool. She is not taking fiber supplement as recommended. She does experience diarrhea every time she drinks a can of Nepro. She denies melena or rectal bleeding. She also denies abdominal pain.  Current Medications: Current Outpatient Prescriptions  Medication Sig Dispense Refill  . acetaminophen (TYLENOL) 500 MG tablet Take 500 mg by mouth every 6 (six) hours as needed for pain.      Marland Kitchen allopurinol (ZYLOPRIM) 100 MG tablet Take 100 mg by mouth daily.       Marland Kitchen amLODipine (NORVASC) 5 MG tablet Take 5 mg by mouth at bedtime.       Marland Kitchen aspirin EC 81 MG tablet Take 81 mg by mouth daily.      . cinacalcet (SENSIPAR) 60 MG tablet Take 30 mg by mouth at bedtime.       Marland Kitchen FIBER FORMULA PO Take 30 mLs by mouth daily as needed (for constipation).       . Heparin Lock Flush (HEPARIN FLUSH) 10 UNIT/ML injection 10 Units. Patient states that she gets this at the time of her dialysis      . lanthanum (FOSRENOL) 1000 MG chewable tablet Chew 1,000 mg by mouth 2 (two) times daily with a meal. With snacks      . lisinopril (PRINIVIL,ZESTRIL) 20 MG tablet Take 40 mg by mouth at  bedtime.       . Multiple Vitamins-Minerals (DIALYVITE 800/ULTRA D PO) Take 1 tablet by mouth daily.       Marland Kitchen omeprazole (PRILOSEC) 20 MG capsule Take 20 mg by mouth 2 (two) times daily.        No current facility-administered medications for this visit.     Objective: Blood pressure 130/76, pulse 80, temperature 98 F (36.7 C), temperature source Oral, resp. rate 18, height 5\' 4"  (1.626 m), weight 145 lb (65.772 kg). Patient is alert and in no acute distress. She is able to move from chair to examination table with minimal assistance. Conjunctiva is pink. Sclera is nonicteric Oropharyngeal mucosa is normal. No neck masses or thyromegaly noted. Cardiac exam with regular rhythm normal S1 and S2. She has grade 3/6 systolic ejection murmur best heard at LLSB and AA. Lungs are clear to auscultation. Abdomen is symmetrical soft and nontender. Liver edge is firm. Spleen is not palpable.  No LE edema or clubbing noted.  Labs/studies Results: She had abdominopelvic CT on 06/27/2013. It reveals changes of cirrhosis and splenomegaly. Lesion in right lobe of liver unchanged since March 2013 and felt to be complex cyst with focal rim calcification. Small hypervascular lesion noted in the right lobe anteriorly suspicious for dysplastic nodule but HCC.     Assessment:  #1. Liver lesions. Cystic lesion  in the right hepatic lobe posteriorly appears to be stable and therefore unlikely to be malignant neoplasm. Small hypervascular lesion noted in the right lobe anteriorly which will need to be followed closely. #2. History of invasive carcinoma and a polyp at transverse colon. Surgery not undertaken because patient deemed to be high risk. So far no evidence of residual or recurrent disease. #3. Nausea and vomiting. She is having more nausea and occasional vomiting. She possibly has an element of gastroparesis. #4. Weight loss. Suspect this is secondary to diminished calorie intake since she lives  alone and also has poor appetite.   Plan:  Alpha-fetoprotein, CEA and LFTs with next blood draw at the time of hemodialysis. Ondansetron 4 mg by mouth twice a day when necessary. Patient advised to take 3-4 g of fiber supplements daily. Can decrease omeprazole to 20 mg by mouth every morning. Office visit in 6 months.

## 2013-07-03 NOTE — Patient Instructions (Signed)
Remember to take fiber supplements daily as recommended. Physician will contact you or family member with results of blood work. Weight check in 2 months. Office visit in 6 months

## 2013-07-04 MED ORDER — OMEPRAZOLE 20 MG PO CPDR: 20.0000 mg | DELAYED_RELEASE_CAPSULE | Freq: Every day | ORAL | Status: DC

## 2013-07-10 DIAGNOSIS — R42 Dizziness and giddiness: Secondary | ICD-10-CM

## 2013-07-10 DIAGNOSIS — Z9181 History of falling: Secondary | ICD-10-CM

## 2013-07-10 DIAGNOSIS — R279 Unspecified lack of coordination: Secondary | ICD-10-CM

## 2013-07-10 LAB — HEPATIC FUNCTION PANEL
ALT: 11 U/L (ref 0–35)
Albumin: 4 g/dL (ref 3.5–5.2)
Bilirubin, Direct: 0.1 mg/dL (ref 0.0–0.3)
Indirect Bilirubin: 0.5 mg/dL (ref 0.0–0.9)
Total Protein: 5.9 g/dL — ABNORMAL LOW (ref 6.0–8.3)

## 2013-07-10 LAB — CBC
HCT: 34 % — ABNORMAL LOW (ref 36.0–46.0)
MCH: 30.8 pg (ref 26.0–34.0)
MCHC: 33.8 g/dL (ref 30.0–36.0)
MCV: 91.2 fL (ref 78.0–100.0)
Platelets: 152 10*3/uL (ref 150–400)
RDW: 15 % (ref 11.5–15.5)
WBC: 5.6 10*3/uL (ref 4.0–10.5)

## 2013-07-11 LAB — AFP TUMOR MARKER: AFP-Tumor Marker: 4.5 ng/mL (ref 0.0–8.0)

## 2013-08-01 ENCOUNTER — Ambulatory Visit: Payer: Medicare Other | Admitting: Family Medicine

## 2013-08-07 ENCOUNTER — Encounter: Payer: Self-pay | Admitting: Family Medicine

## 2013-08-07 ENCOUNTER — Ambulatory Visit (INDEPENDENT_AMBULATORY_CARE_PROVIDER_SITE_OTHER): Payer: Medicare Other | Admitting: Family Medicine

## 2013-08-07 VITALS — BP 128/60 | Ht 64.0 in | Wt 144.0 lb

## 2013-08-07 DIAGNOSIS — R413 Other amnesia: Secondary | ICD-10-CM

## 2013-08-07 DIAGNOSIS — E1165 Type 2 diabetes mellitus with hyperglycemia: Secondary | ICD-10-CM

## 2013-08-07 DIAGNOSIS — E1121 Type 2 diabetes mellitus with diabetic nephropathy: Secondary | ICD-10-CM

## 2013-08-07 DIAGNOSIS — N058 Unspecified nephritic syndrome with other morphologic changes: Secondary | ICD-10-CM

## 2013-08-07 DIAGNOSIS — E1129 Type 2 diabetes mellitus with other diabetic kidney complication: Secondary | ICD-10-CM

## 2013-08-07 DIAGNOSIS — N189 Chronic kidney disease, unspecified: Secondary | ICD-10-CM

## 2013-08-07 DIAGNOSIS — I10 Essential (primary) hypertension: Secondary | ICD-10-CM

## 2013-08-07 NOTE — Progress Notes (Signed)
  Subjective:    Patient ID: Julia Love, female    DOB: 07-25-1929, 79 y.o.   MRN: [id number]  HPIHere for a med check up.   Concerns about joints hurting. Started over the weekend. More joint pain. Knees hand and shoulder. Uses an occasional Tylenol for the pain.  Has had Flu and pneumonia vaccine.  Forgetfulness is ongoing, but being more forgetfulneul with financial matters and bills. Recently the family has begun to help with financial matters. Taking over the bills now that she is more forgetful.  Patient claims compliance with her blood pressure medicine. Watching salt intake. Walking some. Not much however.  Reports that her sugars are generally in good control. Saw an eye Dr. approximately a year ago.    Review of Systems No headache no chest pain no back pain no abdominal pain ROS otherwise negative    Objective:   Physical Exam Alert HEENT normal. Blood pressure good on repeat. Lungs clear. Heart regular in rhythm. Ankles without edema. Hands significant changes of osteoarthritis noted.  Patient is oriented x3 today speaking in full sentences. Thoughtful answering well oriented completely.       Assessment & Plan:  Impression ongoing short-term memory loss. #2 hypertension good control. #3 osteoarthritis discussed at length. #4 type 2 diabetes good control on diet alone. Plan diet exercise discussed. Vaccines already given. Maintain same medications. Recheck in 4 months. WSL

## 2013-08-12 DIAGNOSIS — N189 Chronic kidney disease, unspecified: Secondary | ICD-10-CM | POA: Insufficient documentation

## 2013-08-12 DIAGNOSIS — E1165 Type 2 diabetes mellitus with hyperglycemia: Secondary | ICD-10-CM | POA: Insufficient documentation

## 2013-08-12 DIAGNOSIS — E1121 Type 2 diabetes mellitus with diabetic nephropathy: Secondary | ICD-10-CM | POA: Insufficient documentation

## 2013-08-12 DIAGNOSIS — I10 Essential (primary) hypertension: Secondary | ICD-10-CM | POA: Insufficient documentation

## 2013-08-30 ENCOUNTER — Telehealth (INDEPENDENT_AMBULATORY_CARE_PROVIDER_SITE_OTHER): Payer: Self-pay | Admitting: *Deleted

## 2013-08-30 ENCOUNTER — Telehealth: Payer: Self-pay | Admitting: Family Medicine

## 2013-08-30 NOTE — Telephone Encounter (Signed)
See [facility] shot record attached to chart

## 2013-08-30 NOTE — Telephone Encounter (Signed)
Weight check = 143.3 lbs

## 2013-08-30 NOTE — Telephone Encounter (Signed)
Will recheck weight and 8 weeks.

## 2013-08-30 NOTE — Telephone Encounter (Signed)
Patient should return to the office 11/01/13 for a weight check. Called her daughter to make her aware.

## 2013-08-30 NOTE — Telephone Encounter (Signed)
Dr.[name], Nima was seen in the office on 07/03/13. At this time she weighed 145 lbs. Today she presented to office and she weighed 143.3 lbs. Patient will be contacted with your recommendations.

## 2013-08-30 NOTE — Telephone Encounter (Signed)
Immunization record updated.

## 2013-09-20 ENCOUNTER — Encounter (HOSPITAL_COMMUNITY): Payer: Self-pay | Admitting: Pharmacy Technician

## 2013-09-24 ENCOUNTER — Encounter (HOSPITAL_COMMUNITY): Payer: Self-pay | Admitting: Pharmacy Technician

## 2013-09-24 NOTE — Discharge Instructions (Addendum)
Julia Love  09/24/2013     Instructions    Activity: No Restrictions.   Diet: Resume Diet you were on at home.   Pain Medication: Tylenol if Needed.   CONTACT YOUR DOCTOR IF YOU HAVE PAIN, REDNESS IN YOUR EYE, OR DECREASED VISION.   Follow-up: today between 1:00-2:00  with Elta Guadeloupe T. Gershon Crane, MD.   Dr. Loran Senters: [phone]      If you find that you cannot contact your physician, but feel that your signs and   Symptoms warrant a physician's attention, call the Emergency Room at   [phone] [phone].

## 2013-09-25 ENCOUNTER — Encounter (HOSPITAL_COMMUNITY): Payer: Self-pay | Admitting: *Deleted

## 2013-09-25 ENCOUNTER — Ambulatory Visit (HOSPITAL_COMMUNITY)
Admission: RE | Admit: 2013-09-25 | Discharge: 2013-09-25 | Disposition: A | Discharge status: Home / Self Care | Payer: Medicare Other | Source: Ambulatory Visit | Attending: Ophthalmology | Admitting: Ophthalmology

## 2013-09-25 ENCOUNTER — Encounter (HOSPITAL_COMMUNITY)
Admission: RE | Disposition: A | Discharge status: Home / Self Care | Payer: Self-pay | Source: Ambulatory Visit | Attending: Ophthalmology

## 2013-09-25 DIAGNOSIS — Z79899 Other long term (current) drug therapy: Secondary | ICD-10-CM | POA: Insufficient documentation

## 2013-09-25 DIAGNOSIS — I10 Essential (primary) hypertension: Secondary | ICD-10-CM | POA: Insufficient documentation

## 2013-09-25 DIAGNOSIS — Z7982 Long term (current) use of aspirin: Secondary | ICD-10-CM | POA: Insufficient documentation

## 2013-09-25 DIAGNOSIS — H26499 Other secondary cataract, unspecified eye: Secondary | ICD-10-CM | POA: Insufficient documentation

## 2013-09-25 HISTORY — PX: YAG LASER APPLICATION: SHX6189

## 2013-09-25 SURGERY — TREATMENT, USING YAG LASER
Anesthesia: LOCAL | Laterality: Left

## 2013-09-25 MED ORDER — TROPICAMIDE 1 % OP SOLN
1.0000 [drp] | OPHTHALMIC | Status: AC
  Administered 2013-09-25 (×2): 1 [drp] via OPHTHALMIC

## 2013-09-25 MED ORDER — TROPICAMIDE 1 % OP SOLN
OPHTHALMIC | Status: AC
  Filled 2013-09-25: qty 3

## 2013-09-25 NOTE — H&P (Signed)
The patient was re examined and there is no change in the patients condition since the original H and P. 

## 2013-09-25 NOTE — Brief Op Note (Signed)
Julia Encalade T. Gershon Crane, MD  Procedure: Yag Capsulotomy  Yag Laser Self Test Completedyes. Procedure: Posterior Capsulotomy, Eye Protection Worn by Staff yes. Laser In Use Sign on Door yes.  Laser: Nd:YAG Spot Size: Fixed Burst Mode: III Power Setting: 3.2 mJ/burst Number of shots: 18 Total energy delivered: 58.1 mJ   The patient tolerated the procedure without difficulty. No complications were encountered.   The patient was discharged home with the instructions to continue all her current glaucoma medications, if any.   Patient instructed to go to office at 0100 for intraocular pressure check.  Patient verbalizes understanding of discharge instructions yes.    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

## 2013-09-27 ENCOUNTER — Encounter (HOSPITAL_COMMUNITY): Payer: Self-pay | Admitting: Ophthalmology

## 2013-10-08 NOTE — Discharge Instructions (Signed)
Julia Love  10/08/2013     Instructions    Activity: No Restrictions.   Diet: Resume Diet you were on at home.   Pain Medication: Tylenol if Needed.   CONTACT YOUR DOCTOR IF YOU HAVE PAIN, REDNESS IN YOUR EYE, OR DECREASED VISION.   Follow-up:{follow up:32580} with Elta Guadeloupe T. Gershon Crane, MD.   Dr. Loran Senters: [phone]  Dr. Iona Hansen: [phone]  Dr. Geoffry Paradise: [phone]   If you find that you cannot contact your physician, but feel that your signs and   Symptoms warrant a physician's attention, call the Emergency Room at   [phone] ext.[phone].   Other{NA AND ZNBVAPOL:41030}.

## 2013-10-09 MED ORDER — TROPICAMIDE 1 % OP SOLN
OPHTHALMIC | Status: AC
  Filled 2013-10-09: qty 3

## 2013-10-09 NOTE — H&P (Signed)
The patient was re examined and there is no change in the patients condition since the original H and P. 

## 2013-10-09 NOTE — OR Nursing (Signed)
Patient called and cancelled her procedure. Stated she is sick.

## 2013-10-23 ENCOUNTER — Ambulatory Visit (HOSPITAL_COMMUNITY): Admission: RE | Admit: 2013-10-23 | Payer: Medicare Other | Source: Ambulatory Visit | Admitting: Ophthalmology

## 2013-10-23 ENCOUNTER — Encounter (HOSPITAL_COMMUNITY): Admission: RE | Payer: Self-pay | Source: Ambulatory Visit

## 2013-10-23 SURGERY — TREATMENT, USING YAG LASER
Anesthesia: LOCAL | Laterality: Right

## 2013-10-23 NOTE — H&P (Deleted)
The patient was re examined and there is no change in the patients condition since the original H and P. 

## 2013-10-29 ENCOUNTER — Other Ambulatory Visit (INDEPENDENT_AMBULATORY_CARE_PROVIDER_SITE_OTHER): Payer: Self-pay | Admitting: Internal Medicine

## 2013-10-29 NOTE — Telephone Encounter (Signed)
Needs to go to PCP

## 2013-11-07 ENCOUNTER — Encounter (INDEPENDENT_AMBULATORY_CARE_PROVIDER_SITE_OTHER): Payer: Self-pay | Admitting: Internal Medicine

## 2013-11-07 ENCOUNTER — Ambulatory Visit (INDEPENDENT_AMBULATORY_CARE_PROVIDER_SITE_OTHER): Payer: Medicare Other | Admitting: Internal Medicine

## 2013-11-07 ENCOUNTER — Telehealth (INDEPENDENT_AMBULATORY_CARE_PROVIDER_SITE_OTHER): Payer: Self-pay | Admitting: *Deleted

## 2013-11-07 ENCOUNTER — Other Ambulatory Visit (INDEPENDENT_AMBULATORY_CARE_PROVIDER_SITE_OTHER): Payer: Self-pay | Admitting: *Deleted

## 2013-11-07 VITALS — BP 150/50 | HR 80 | Temp 98.4°F | Ht 64.0 in | Wt 146.8 lb

## 2013-11-07 DIAGNOSIS — C189 Malignant neoplasm of colon, unspecified: Secondary | ICD-10-CM

## 2013-11-07 DIAGNOSIS — D649 Anemia, unspecified: Secondary | ICD-10-CM

## 2013-11-07 DIAGNOSIS — R195 Other fecal abnormalities: Secondary | ICD-10-CM

## 2013-11-07 DIAGNOSIS — Z1211 Encounter for screening for malignant neoplasm of colon: Secondary | ICD-10-CM

## 2013-11-07 MED ORDER — PEG-KCL-NACL-NASULF-NA ASC-C 100 G PO SOLR: 1.0000 | Freq: Once | ORAL | Status: DC

## 2013-11-07 NOTE — Patient Instructions (Signed)
Colonoscopy

## 2013-11-07 NOTE — Telephone Encounter (Signed)
Not the day after.

## 2013-11-07 NOTE — Telephone Encounter (Signed)
Ms Ramnath is sch'd for TCS and gets Heparin IV with dialysis -- will this interfere with TCS -- please advise

## 2013-11-07 NOTE — Progress Notes (Signed)
Subjective:     Patient ID: Julia Love, female   DOB: 26-Mar-1929, 80 y.o.   MRN: [id number]  HPI Here today for f/u. Referred to our office for heme postive stools and anemia by Dr. Florene Glen.  One of 2 of 3 stools was positive. 11/01/2013 H and H 9.4 and 30.0, MCV 99.  Hemoglobin in October of 2014 11.5. TIBC 192, Transferrin 160, ALP 149 Last seen 07/03/13 by Dr. Laural Golden Hx of cryptogenic cirrhosis. History of invasive carcinoma in a colonic polyp found on colonoscopy in February 2013. Surgery was considered but postponed because of underlying cirrhosis. CEA was normal at that time.  She was referred to Dr. Jackolyn Confer for possible partial colectomy. Deferred due to new diagnosis of cirrhosis.    She tells me she doesn't eat like she use to. She has gained a little over a pound since December. She has a lot of flatus. She usually has a BM one a day or every other day. Dialysis Tue-Thur-Sat   06/21/2013 CT abdomen/pelvis;  IMPRESSION:  1. Small hypervascular lesion in the right hepatic lobe suspicious  for enhancing dysplastic nodule or small HCC in the setting of  cirrhosis.  2. Stable benign appearing complex cyst in the right hepatic lobe.  3. Stable changes of cirrhosis with splenomegaly.  4. Recommend abdominal MRI examination without and with contrast in  3-4 months to re-evaluate the above lesion.  10/20/2011 Colonoscopy with polypectomy:  Impression:  Examination performed to cecum.  Left-sided diverticulosis.  Six polyps were snared and tube were coagulated.  Cecal polyp was 10 mm. 2 hemoclips applied to polypectomy site to prevent risk of polypectomy bleed.  Polyp at transverse colon at surface ulceration lobectomy was complete.  Biopsy: Cecal: Serrated adenoma. No high grade dysplasia or malignancy identified.  Proximal colon: Tubular adenoma. No high grade dysplasia or malignancy identified. Mid transverse: Invasive adenocarcinoma arising in a tubular adenoma. Invasive  carcinoma involves cauterized edge of the specimen. Rectum: Tubular adenoma. No high grade dysplasia or malignancy identified.  11/24/2011 MRI Liver IMPRESSION:  1. Hepatic cirrhosis.  2. 1.9 cm near fluid T2 hyperintense lesion in the right hepatic  lobe, consistent with benign hemangioma or cyst. No hepatic  malignancy identified on this noncontrast study.  3. 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   11/10/2011 CT abdomen/pelvis with CM: 1. No colonic mass is identified. Moderate diverticulosis of the  sigmoid colon.  2. Cirrhotic changes involving the liver. There is a 2.5 cm  indeterminate right hepatic lobe lesion. Recommend correlation  with alpha-fetoprotein level. MRI even without contrast may be  helpful for better characterization of a complex cyst versus solid  mass.  3. Mild splenomegaly.  4. Advanced atherosclerotic vascular disease.  5. Atrophied and cystic solitary left kidney.    Review of Systems Past Medical History  Diagnosis Date  . S/P tonsillectomy     1945  . History of nephrectomy     (right) for a non-functioning kidney in 1949  . S/P complete hysterectomy     1990  . Hypertension     for several years  . Fistula     rt arm for dialysis which is not in use at present  . Hemodialysis patient     since November 2005  . Gastric ulcer     back in 1989  . Parathyroid disorder     surgery in August 2011  . Rectocele     repair several years ago  .  GERD (gastroesophageal reflux disease)   . S/P laparoscopic cholecystectomy   . Cancer     Colon cancer  . ESRD (end stage renal disease) on dialysis   . Shortness of breath     with exertion  . Heart murmur     per Dr Verl Blalock  . H/O hiatal hernia   . Gout     Past Surgical History  Procedure Laterality Date  . Abdominal hysterectomy  1990  . Colonoscopy  10/20/2011    Procedure: COLONOSCOPY;  Surgeon: Rogene Houston, MD;  [facility]: [facility];  Service: Endoscopy;   Laterality: N/A;  100  . Esophagogastroduodenoscopy  10/20/2011    Procedure: ESOPHAGOGASTRODUODENOSCOPY (EGD);  Surgeon: Rogene Houston, MD;  Location: [facility];  Service: Endoscopy;  Laterality: N/A;  Possible EGD  . Laparoscopic nephrectomy  1949  . Eye surgery  2009, 2005    bilateral  . Dg av dialysis graft declot or    . Cholecystectomy  08  . Parathyroidectomy  07/07/09  . Rectocele repair    . Tonsillectomy    . Colonoscopy  11/05/2011    Procedure: COLONOSCOPY;  Surgeon: Rogene Houston, MD;  [facility]: [facility];  Service: Endoscopy;  Laterality: N/A;  730  . Thrombectomy and revision of arterioventous (av) goretex  graft Right 11/23/2012    Procedure: THROMBECTOMY AND REVISION OF RIGHT ARM ARTERIOVENTOUS   GRAFT USING 7MM X 10CM THIN WALL STRETCH GORETEX GRAFT.;  Surgeon: Conrad Drew, MD;  Location: [facility];  Service: Vascular;  Laterality: Right;  . Thrombectomy and revision of arterioventous (av) goretex  graft Right 12/01/2012    Procedure: THROMBECTOMY AND REVISION OF ARTERIOVENTOUS (AV) GORETEX  GRAFT;  Surgeon: Mal Misty, MD;  Location: [facility];  Service: Vascular;  Laterality: Right;  . Yag laser application Left [id number]    Procedure: YAG LASER APPLICATION;  Surgeon: Elta Guadeloupe T. Gershon Crane, MD;  Location: [facility];  Service: Ophthalmology;  Laterality: Left;    Allergies  Allergen Reactions  . Vicodin [Hydrocodone-Acetaminophen] Other (See Comments)    Makes sleep for long periods of time  . Contrast Media [Iodinated Diagnostic Agents] Itching    Current Outpatient Prescriptions on File Prior to Visit  Medication Sig Dispense Refill  . acetaminophen (TYLENOL) 500 MG tablet Take 500 mg by mouth every 6 (six) hours as needed for pain.      Marland Kitchen allopurinol (ZYLOPRIM) 100 MG tablet Take 100 mg by mouth daily.       Marland Kitchen amLODipine (NORVASC) 5 MG tablet Take 5 mg by mouth at bedtime.       Marland Kitchen aspirin EC 81 MG tablet Take 81 mg by mouth daily.      . cinacalcet (SENSIPAR) 30 MG  tablet Take 30 mg by mouth at bedtime.      Marland Kitchen FIBER FORMULA PO Take 30 mLs by mouth daily as needed (for constipation).       . Heparin Lock Flush (HEPARIN FLUSH) 10 UNIT/ML injection 10 Units. Patient states that she gets this at the time of her dialysis      . lanthanum (FOSRENOL) 1000 MG chewable tablet Chew 1,000-2,000 mg by mouth. Take 2 with meal and 1 with snacks      . lisinopril (PRINIVIL,ZESTRIL) 20 MG tablet Take 40 mg by mouth at bedtime.       . Multiple Vitamins-Minerals (DIALYVITE 800/ULTRA D PO) Take 1 tablet by mouth daily.       Marland Kitchen omeprazole (PRILOSEC)  20 MG capsule Take 20 mg by mouth 2 (two) times daily.      . ondansetron (ZOFRAN) 4 MG tablet Take 1 tablet (4 mg total) by mouth every 12 (twelve) hours as needed for nausea.  30 tablet  0   No current facility-administered medications on file prior to visit.        Objective:   Physical Exam  Filed Vitals:   11/07/13 1020  BP: 150/50  Pulse: 80  Temp: 98.4 F (36.9 C)  Height: 5\' 4"  (1.626 m)  Weight: 146 lb 12.8 oz (66.588 kg)  Alert and oriented. Skin warm and dry. Oral mucosa is moist.   . Sclera anicteric, conjunctivae is pink. Thyroid not enlarged. No cervical lymphadenopathy. Lungs clear. Heart regular rate and rhythm.  Abdomen is soft. Bowel sounds are positive. No hepatomegaly. No abdominal masses felt. No tenderness.  No edema to lower extremities. Stool brown and guaiac positive.      Assessment:    Anemia, Heme + stools. Hx of invasive colon carcinoma arising from a tubular adenoma. not treated due to underlying cirrhosis. I discussed this case with Dr. Laural Golden.    Plan:     Colonoscopy with Dr. Laural Golden

## 2013-11-07 NOTE — Telephone Encounter (Signed)
Patient needs movi prep 

## 2013-11-12 NOTE — Telephone Encounter (Signed)
Patient aware.

## 2013-11-13 ENCOUNTER — Encounter (HOSPITAL_COMMUNITY): Payer: Self-pay | Admitting: Pharmacy Technician

## 2013-11-14 ENCOUNTER — Encounter: Payer: Self-pay | Admitting: Family Medicine

## 2013-11-14 ENCOUNTER — Ambulatory Visit (INDEPENDENT_AMBULATORY_CARE_PROVIDER_SITE_OTHER): Payer: Medicare Other | Admitting: Family Medicine

## 2013-11-14 VITALS — BP 130/68 | Ht 64.0 in | Wt 145.5 lb

## 2013-11-14 DIAGNOSIS — E1165 Type 2 diabetes mellitus with hyperglycemia: Secondary | ICD-10-CM

## 2013-11-14 DIAGNOSIS — R42 Dizziness and giddiness: Secondary | ICD-10-CM

## 2013-11-14 DIAGNOSIS — I10 Essential (primary) hypertension: Secondary | ICD-10-CM

## 2013-11-14 DIAGNOSIS — E118 Type 2 diabetes mellitus with unspecified complications: Secondary | ICD-10-CM

## 2013-11-14 DIAGNOSIS — IMO0002 Reserved for concepts with insufficient information to code with codable children: Secondary | ICD-10-CM

## 2013-11-14 NOTE — Progress Notes (Signed)
   Subjective:    Patient ID: Julia Love, female    DOB: [birth year], 80 y.o.   MRN: [id number]  Hypertension This is a chronic problem. The current episode started more than 1 year ago. The problem has been gradually improving since onset. The problem is controlled. There are no associated agents to hypertension. There are no known risk factors for coronary artery disease. Treatments tried: lisinopril. The current treatment provides significant improvement. There are no compliance problems.   Patient states she has a spot on her left leg that is red and swollen. It has been present for about several weeks now. Has an upcoming appointment on Monday with Dr. Nevada Crane.   Glucose has been in good control watching diet closely  BP overall good, doing well numbers good compliant with medication.  Not so good on the exercise  Dizziness a chronic concern. At times unsteady at times a spinning sensation to  Has a spot on her back she is also concerned about. Seems to want to play cough. Irritated at times.  Reports his sugars are doing well. Mainly followed by the specialist at dialysis.  Rash going on, rash started scaly and then started a few wks ago, tried neosporin  Review of Systems No headache no chest pain no back pain no abdominal pain no change have some blood in stool ROS otherwise negative    Objective:   Physical Exam  Alert vital stable. Lungs clear. Heart regular in rhythm. H&T normal. Back reveals a seborrheic keratosis slightly irritated. Ankles without edema      Assessment & Plan:  Impression 1 hypertension stable. #2 chronic dizziness ongoing. #3 memory difficulties see prior notes. Patient reports these are stable. #4 seborrheic keratosis discussed plan diet exercise discussed. Recheck in 6 months. Maintain same medications. Followup with dermatologist as scheduled.

## 2013-11-19 NOTE — Discharge Instructions (Signed)
Julia Love  11/19/2013     Instructions    Activity: No Restrictions.   Diet: Resume Diet you were on at home.   Pain Medication: Tylenol if Needed.   CONTACT YOUR DOCTOR IF YOU HAVE PAIN, REDNESS IN YOUR EYE, OR DECREASED VISION.   Follow-up: today between 1:00-2:00  with Elta Guadeloupe T. Gershon Crane, MD.   Dr. Loran Senters: [phone]     If you find that you cannot contact your physician, but feel that your signs and   Symptoms warrant a physician's attention, call the Emergency Room at   [phone] [phone].

## 2013-11-20 ENCOUNTER — Encounter (HOSPITAL_COMMUNITY): Payer: Self-pay | Admitting: *Deleted

## 2013-11-20 ENCOUNTER — Ambulatory Visit (HOSPITAL_COMMUNITY)
Admission: RE | Admit: 2013-11-20 | Discharge: 2013-11-20 | Disposition: A | Discharge status: Home / Self Care | Payer: Medicare Other | Source: Ambulatory Visit | Attending: Ophthalmology | Admitting: Ophthalmology

## 2013-11-20 ENCOUNTER — Encounter (HOSPITAL_COMMUNITY)
Admission: RE | Disposition: A | Discharge status: Home / Self Care | Payer: Self-pay | Source: Ambulatory Visit | Attending: Ophthalmology

## 2013-11-20 DIAGNOSIS — E785 Hyperlipidemia, unspecified: Secondary | ICD-10-CM | POA: Insufficient documentation

## 2013-11-20 DIAGNOSIS — Z7982 Long term (current) use of aspirin: Secondary | ICD-10-CM | POA: Insufficient documentation

## 2013-11-20 DIAGNOSIS — I10 Essential (primary) hypertension: Secondary | ICD-10-CM | POA: Insufficient documentation

## 2013-11-20 DIAGNOSIS — E119 Type 2 diabetes mellitus without complications: Secondary | ICD-10-CM | POA: Insufficient documentation

## 2013-11-20 DIAGNOSIS — Z79899 Other long term (current) drug therapy: Secondary | ICD-10-CM | POA: Insufficient documentation

## 2013-11-20 DIAGNOSIS — H26499 Other secondary cataract, unspecified eye: Secondary | ICD-10-CM | POA: Insufficient documentation

## 2013-11-20 HISTORY — PX: YAG LASER APPLICATION: SHX6189

## 2013-11-20 SURGERY — TREATMENT, USING YAG LASER
Anesthesia: LOCAL | Laterality: Right

## 2013-11-20 MED ORDER — TROPICAMIDE 1 % OP SOLN
OPHTHALMIC | Status: AC
  Filled 2013-11-20: qty 3

## 2013-11-20 MED ORDER — TROPICAMIDE 1 % OP SOLN: 1.0000 [drp] | OPHTHALMIC | Status: AC

## 2013-11-20 MED ORDER — TROPICAMIDE 1 % OP SOLN
1.0000 [drp] | OPHTHALMIC | Status: AC
  Administered 2013-11-20 (×2): 1 [drp] via OPHTHALMIC

## 2013-11-20 NOTE — H&P (Signed)
The patient was re examined and there is no change in the patients condition since the original H and P. 

## 2013-11-20 NOTE — Brief Op Note (Signed)
Julia Love T. Gershon Crane, MD  Procedure: Yag Capsulotomy  Yag Laser Self Test Completedyes. Procedure: Posterior Capsulotomy, Eye Protection Worn by Staff yes. Laser In Use Sign on Door yes.  Laser: Nd:YAG Spot Size: Fixed Burst Mode: III Power Setting: 3.0 mJ/burst Number of shots: 14 Total energy delivered: 41.5 mJ   The patient tolerated the procedure without difficulty. No complications were encountered.   The patient was discharged home with the instructions to continue all her current glaucoma medications, if any.   Patient instructed to go to office at 0100 for intraocular pressure check.  Patient verbalizes understanding of discharge instructions yes.    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

## 2013-11-22 ENCOUNTER — Encounter (HOSPITAL_COMMUNITY): Payer: Self-pay | Admitting: Ophthalmology

## 2013-11-26 ENCOUNTER — Other Ambulatory Visit (INDEPENDENT_AMBULATORY_CARE_PROVIDER_SITE_OTHER): Payer: Self-pay | Admitting: Internal Medicine

## 2013-12-05 ENCOUNTER — Ambulatory Visit (HOSPITAL_COMMUNITY)
Admission: RE | Admit: 2013-12-05 | Discharge: 2013-12-05 | Disposition: A | Discharge status: Home / Self Care | Payer: Medicare Other | Source: Ambulatory Visit | Attending: Internal Medicine | Admitting: Internal Medicine

## 2013-12-05 ENCOUNTER — Encounter (HOSPITAL_COMMUNITY): Payer: Self-pay | Admitting: *Deleted

## 2013-12-05 ENCOUNTER — Encounter (HOSPITAL_COMMUNITY)
Admission: RE | Disposition: A | Discharge status: Home / Self Care | Payer: Self-pay | Source: Ambulatory Visit | Attending: Internal Medicine

## 2013-12-05 DIAGNOSIS — K644 Residual hemorrhoidal skin tags: Secondary | ICD-10-CM | POA: Insufficient documentation

## 2013-12-05 DIAGNOSIS — K5730 Diverticulosis of large intestine without perforation or abscess without bleeding: Secondary | ICD-10-CM | POA: Insufficient documentation

## 2013-12-05 DIAGNOSIS — K7460 Unspecified cirrhosis of liver: Secondary | ICD-10-CM | POA: Insufficient documentation

## 2013-12-05 DIAGNOSIS — R195 Other fecal abnormalities: Secondary | ICD-10-CM

## 2013-12-05 DIAGNOSIS — Z7982 Long term (current) use of aspirin: Secondary | ICD-10-CM | POA: Insufficient documentation

## 2013-12-05 DIAGNOSIS — Z85038 Personal history of other malignant neoplasm of large intestine: Secondary | ICD-10-CM

## 2013-12-05 DIAGNOSIS — Z860100 Personal history of colon polyps, unspecified: Secondary | ICD-10-CM | POA: Insufficient documentation

## 2013-12-05 DIAGNOSIS — D649 Anemia, unspecified: Secondary | ICD-10-CM

## 2013-12-05 DIAGNOSIS — Z87891 Personal history of nicotine dependence: Secondary | ICD-10-CM | POA: Insufficient documentation

## 2013-12-05 DIAGNOSIS — I120 Hypertensive chronic kidney disease with stage 5 chronic kidney disease or end stage renal disease: Secondary | ICD-10-CM | POA: Insufficient documentation

## 2013-12-05 DIAGNOSIS — K6389 Other specified diseases of intestine: Secondary | ICD-10-CM

## 2013-12-05 DIAGNOSIS — Z992 Dependence on renal dialysis: Secondary | ICD-10-CM | POA: Insufficient documentation

## 2013-12-05 DIAGNOSIS — C189 Malignant neoplasm of colon, unspecified: Secondary | ICD-10-CM

## 2013-12-05 DIAGNOSIS — N186 End stage renal disease: Secondary | ICD-10-CM | POA: Insufficient documentation

## 2013-12-05 DIAGNOSIS — K921 Melena: Secondary | ICD-10-CM | POA: Insufficient documentation

## 2013-12-05 HISTORY — PX: COLONOSCOPY: SHX5424

## 2013-12-05 SURGERY — COLONOSCOPY
Anesthesia: Moderate Sedation

## 2013-12-05 MED ORDER — MEPERIDINE HCL 50 MG/ML IJ SOLN
INTRAMUSCULAR | Status: DC | PRN
  Administered 2013-12-05: 12.5 mg via INTRAVENOUS
  Administered 2013-12-05: 25 mg via INTRAVENOUS
  Administered 2013-12-05: 12.5 mg via INTRAVENOUS

## 2013-12-05 MED ORDER — MEPERIDINE HCL 50 MG/ML IJ SOLN
INTRAMUSCULAR | Status: AC
  Filled 2013-12-05: qty 1

## 2013-12-05 MED ORDER — SODIUM CHLORIDE 0.9 % IV SOLN
INTRAVENOUS | Status: DC
  Administered 2013-12-05: 12:00:00 via INTRAVENOUS

## 2013-12-05 MED ORDER — MIDAZOLAM HCL 5 MG/5ML IJ SOLN
INTRAMUSCULAR | Status: DC | PRN
  Administered 2013-12-05 (×2): 1 mg via INTRAVENOUS
  Administered 2013-12-05: 2 mg via INTRAVENOUS
  Administered 2013-12-05: 1 mg via INTRAVENOUS

## 2013-12-05 MED ORDER — MIDAZOLAM HCL 5 MG/5ML IJ SOLN
INTRAMUSCULAR | Status: AC
  Filled 2013-12-05: qty 10

## 2013-12-05 NOTE — H&P (Signed)
Julia Love is an 80 y.o. female.   Chief Complaint: Patient is here for colonoscopy. HPI: Patient is a 14-year-old Caucasian female with multiple medical problems including history of end-stage liver disease requiring hemodialysis was noted to have heme-positive stools and anemia. She denies melena or rectal bleeding. She has occasional pain across her abdomen she she feels is due to gas. There is no history of dysuria hematuria. GI history significant for a history of invasive carcinoma in a polyp removed from transverse colon in February 2013. She was referred for surgery but preoperative evaluation revealed new diagnosis of cirrhosis. It was therefore decided to monitor her. She has stable lesion along posterior aspect of right lobe. Patient's CEA and AFP levels have remained normal.  Past Medical History  Diagnosis Date  . S/P tonsillectomy     1945  . History of nephrectomy     (right) for a non-functioning kidney in 1949  . S/P complete hysterectomy     1990  . Hypertension     for several years  . Fistula     rt arm for dialysis which is not in use at present  . Hemodialysis patient     since November 2005  . Gastric ulcer     back in 1989  . Parathyroid disorder     surgery in August 2011  . Rectocele     repair several years ago  . GERD (gastroesophageal reflux disease)   . S/P laparoscopic cholecystectomy   . Cancer     Colon cancer  . ESRD (end stage renal disease) on dialysis   . Shortness of breath     with exertion  . Heart murmur     per Dr Verl Blalock  . H/O hiatal hernia   . Gout     Past Surgical History  Procedure Laterality Date  . Abdominal hysterectomy  1990  . Colonoscopy  10/20/2011    Procedure: COLONOSCOPY;  Surgeon: Rogene Houston, MD;  [facility];  Service: Endoscopy;  Laterality: N/A;  100  . Esophagogastroduodenoscopy  10/20/2011    Procedure: ESOPHAGOGASTRODUODENOSCOPY (EGD);  Surgeon: Rogene Houston, MD;  [facility];   Service: Endoscopy;  Laterality: N/A;  Possible EGD  . Laparoscopic nephrectomy  1949  . Eye surgery  2009, 2005    bilateral  . Dg av dialysis graft declot or    . Cholecystectomy  08  . Parathyroidectomy  07/07/09  . Rectocele repair    . Tonsillectomy    . Colonoscopy  11/05/2011    Procedure: COLONOSCOPY;  Surgeon: Rogene Houston, MD;  [facility];  Service: Endoscopy;  Laterality: N/A;  730  . Thrombectomy and revision of arterioventous (av) goretex  graft Right 11/23/2012    Procedure: THROMBECTOMY AND REVISION OF RIGHT ARM ARTERIOVENTOUS   GRAFT USING 7MM X 10CM THIN WALL STRETCH GORETEX GRAFT.;  Surgeon: Conrad Onamia, MD;  Location: [facility];  Service: Vascular;  Laterality: Right;  . Thrombectomy and revision of arterioventous (av) goretex  graft Right 12/01/2012    Procedure: THROMBECTOMY AND REVISION OF ARTERIOVENTOUS (AV) GORETEX  GRAFT;  Surgeon: Mal Misty, MD;  Location: [facility];  Service: Vascular;  Laterality: Right;  . Yag laser application Left 7/78/2423    Procedure: YAG LASER APPLICATION;  Surgeon: Elta Guadeloupe T. Gershon Crane, MD;  [facility];  Service: Ophthalmology;  Laterality: Left;  . Yag laser application Right 5/36/1443    Procedure: YAG LASER APPLICATION;  Surgeon: Elta Guadeloupe T. Gershon Crane, MD;  Location[facility];  Service: Ophthalmology;  Laterality: Right;    Family History  Problem Relation Age of Onset  . Hypertension Mother   . Stroke Mother   . Hypertension Father   . Stroke Father   . Hypertension Daughter   . Hypertension Daughter   . Healthy Son   . Colon cancer Neg Hx    Social History:  reports that she quit smoking about 27 years ago. Her smoking use included Cigarettes. She has a 15 pack-year smoking history. She has never used smokeless tobacco. She reports that she does not drink alcohol or use illicit drugs.  Allergies:  Allergies  Allergen Reactions  . Vicodin [Hydrocodone-Acetaminophen] Other (See Comments)    Makes sleep for long periods  of time  . Contrast Media [Iodinated Diagnostic Agents] Itching    Medications Prior to Admission  Medication Sig Dispense Refill  . acetaminophen (TYLENOL) 500 MG tablet Take 500 mg by mouth every 6 (six) hours as needed for pain.      Marland Kitchen amLODipine (NORVASC) 5 MG tablet Take 5 mg by mouth at bedtime.       Marland Kitchen aspirin EC 81 MG tablet Take 81 mg by mouth daily.      . cinacalcet (SENSIPAR) 30 MG tablet Take 30 mg by mouth at bedtime.      . Heparin Lock Flush (HEPARIN FLUSH) 10 UNIT/ML injection 10 Units. Patient states that she gets this at the time of her dialysis      . lisinopril (PRINIVIL,ZESTRIL) 20 MG tablet Take 40 mg by mouth at bedtime.       Marland Kitchen allopurinol (ZYLOPRIM) 100 MG tablet Take 100 mg by mouth daily.       Marland Kitchen FIBER FORMULA PO Take 30 mLs by mouth daily as needed (for constipation).       . folic acid-vitamin b complex-vitamin c-selenium-zinc (DIALYVITE) 3 MG TABS tablet Take 1 tablet by mouth daily.      Marland Kitchen lanthanum (FOSRENOL) 1000 MG chewable tablet Chew 1,000-2,000 mg by mouth 4 (four) times daily. Take 2 with meal and 1 with snacks      . omeprazole (PRILOSEC) 20 MG capsule Take 20 mg by mouth 2 (two) times daily.      . Vitamin D, Ergocalciferol, (DRISDOL) 50000 UNITS CAPS capsule Take 50,000 Units by mouth every 7 (seven) days. Wednesday        No results found for this or any previous visit (from the past 48 hour(s)). No results found.  ROS  Blood pressure 169/76, pulse 79, temperature 98.1 F (36.7 C), temperature source Oral, resp. rate 14, height 5\' 4"  (1.626 m), weight 145 lb (65.772 kg), SpO2 95.00%. Physical Exam  Constitutional: She appears well-developed and well-nourished.  HENT:  Mouth/Throat: Oropharynx is clear and moist.  Eyes: Conjunctivae are normal. No scleral icterus.  Neck: Normal range of motion. Neck supple.  Cardiovascular: Normal rate and regular rhythm.   Murmur: grade 3/6 systolic ejection murmur heard best at aortic area and  LLSB. Respiratory: Effort normal and breath sounds normal.  GI: Soft. She exhibits no distension and no mass. There is no tenderness.  Musculoskeletal: She exhibits no edema.  AV graft at right forearm  Neurological: She is alert.  Skin: Skin is warm.     Assessment/Plan Heme-positive stools and anemia. History of colonic adenoma with invasive carcinoma removed from transverse colon in February 2013. Diagnostic colonoscopy.  [name],[name] 12/05/2013, 11:47 AM

## 2013-12-05 NOTE — Discharge Instructions (Signed)
Resume usual medications and diet. No driving for 24 hours. Please have lab fax your next H&H.  Colonoscopy, Care After Refer to this sheet in the next few weeks. These instructions provide you with information on caring for yourself after your procedure. Your health care provider may also give you more specific instructions. Your treatment has been planned according to current medical practices, but problems sometimes occur. Call your health care provider if you have any problems or questions after your procedure. WHAT TO EXPECT AFTER THE PROCEDURE  After your procedure, it is typical to have the following:  A small amount of blood in your stool.  Moderate amounts of gas and mild abdominal cramping or bloating. HOME CARE INSTRUCTIONS  Do not drive, operate machinery, or sign important documents for 24 hours.  You may shower and resume your regular physical activities, but move at a slower pace for the first 24 hours.  Take frequent rest periods for the first 24 hours.  Walk around or put a warm pack on your abdomen to help reduce abdominal cramping and bloating.  Drink enough fluids to keep your urine clear or pale yellow.  You may resume your normal diet as instructed by your health care provider. Avoid heavy or fried foods that are hard to digest.  Avoid drinking alcohol for 24 hours or as instructed by your health care provider.  Only take over-the-counter or prescription medicines as directed by your health care provider.  If a tissue sample (biopsy) was taken during your procedure:  Do not take aspirin or blood thinners for 7 days, or as instructed by your health care provider.  Do not drink alcohol for 7 days, or as instructed by your health care provider.  Eat soft foods for the first 24 hours. SEEK MEDICAL CARE IF: You have persistent spotting of blood in your stool 2 3 days after the procedure. SEEK IMMEDIATE MEDICAL CARE IF:  You have more than a small spotting of  blood in your stool.  You pass large blood clots in your stool.  Your abdomen is swollen (distended).  You have nausea or vomiting.  You have a fever.  You have increasing abdominal pain that is not relieved with medicine. Document Released: 04/13/2004 Document Revised: 06/20/2013 Document Reviewed: 05/07/2013 [facility] Patient Information 2014 [place].

## 2013-12-05 NOTE — Op Note (Signed)
COLONOSCOPY PROCEDURE REPORT  PATIENT:  Julia Love  MR#:  [id number] Birthdate:  1929/03/02, 80 y.o., female Endoscopist:  Dr. Rogene Houston, MD Referred By:  Dr. Darrold Span. Florene Glen, MD  Procedure Date: 12/05/2013  Procedure:   Colonoscopy  Indications:  Patient is a 46-year-old Caucasian female who was recently noted to have heme positive stools. She also is anemia. She has history of colonic adenomas. One of the polyps from proximal transverse colon revealed invasive adenocarcinoma. Surgery was considered but not undertaken because of underlying diagnoses of cirrhosis. CEA levels have remained normal. She has lesion in right lobe of her liver which has remained stable and her AFP levels are also normal. She is undergoing diagnostic colonoscopy.  Informed Consent:  The procedure and risks were reviewed with the patient and informed consent was obtained.  Medications:  Demerol 50 mg IV Versed 5 mg IV  Description of procedure:  After a digital rectal exam was performed, that colonoscope was advanced from the anus through the rectum and colon to the area of the cecum, ileocecal valve and appendiceal orifice. The cecum was deeply intubated. These structures were well-seen and photographed for the record. From the level of the cecum and ileocecal valve, the scope was slowly and cautiously withdrawn. The mucosal surfaces were carefully surveyed utilizing scope tip to flexion to facilitate fold flattening as needed. The scope was pulled down into the rectum where a thorough exam including retroflexion was performed.  Findings:   Prep excellent. Scar noted next to tattooed site. No evidence of recurrent polyp or a mass. Patchy edema noted to colonic wall felt to be due to cirrhosis. Spastic segment of sigmoid colon with a few diverticula but no changes of diverticulitis. Normal rectal mucosa.  Small hemorrhoids below the dentate line.    Therapeutic/Diagnostic Maneuvers Performed:    None  Complications:  None  Cecal Withdrawal Time:  9 minutes  Impression:  Examination performed to cecum. No evidence of recurrent polyp or tumor at polypectomy site in the region of proximal transverse colon; it was easily identified because it had been tattooed. Sigmoid colon diverticulosis without evidence of diverticulitis but sigmoid colon noted to be spastic. Small external hemorrhoids.  Recommendations:  Standard instructions given. Will monitor patient's H&H. If there is significant drop may consider EGD at some point.  Keante Urizar U  12/05/2013 12:42 PM  CC: Dr. Rubbie Battiest, MD & Dr. Rayne Du ref. provider found CC Dr. Ollen Gross C. Florene Glen, MD

## 2013-12-06 ENCOUNTER — Telehealth (INDEPENDENT_AMBULATORY_CARE_PROVIDER_SITE_OTHER): Payer: Self-pay | Admitting: *Deleted

## 2013-12-06 NOTE — Telephone Encounter (Signed)
Julia Love said she just had a TCS and she has been passing fluid red in color. Would like to know if this is normal? Her return phone number is [phone].

## 2013-12-06 NOTE — Telephone Encounter (Signed)
Talked with patient and forwarded to Dr.[name] to address.

## 2013-12-06 NOTE — Telephone Encounter (Signed)
Patient was called and made aware. 

## 2013-12-06 NOTE — Telephone Encounter (Signed)
Talked with the patient. This happened last night,only one time. Felt urge to BM. She passed red water , not blood ,or any stool. Messed up clothes. Toilet, and floor per patient. Denies pain , and has not seen anything else.

## 2013-12-06 NOTE — Telephone Encounter (Signed)
Symptoms most likely due to hemorrhoids. No further workup unless symptoms relapse. Please call patient

## 2013-12-10 ENCOUNTER — Encounter (INDEPENDENT_AMBULATORY_CARE_PROVIDER_SITE_OTHER): Payer: Self-pay | Admitting: *Deleted

## 2013-12-11 ENCOUNTER — Encounter (HOSPITAL_COMMUNITY): Payer: Self-pay | Admitting: Internal Medicine

## 2013-12-13 ENCOUNTER — Telehealth: Payer: Self-pay | Admitting: *Deleted

## 2013-12-13 MED ORDER — BENZONATATE 100 MG PO CAPS: 100.0000 mg | ORAL_CAPSULE | Freq: Three times a day (TID) | ORAL | Status: DC | PRN

## 2013-12-13 MED ORDER — AMOXICILLIN 500 MG PO CAPS: 500.0000 mg | ORAL_CAPSULE | Freq: Three times a day (TID) | ORAL | Status: DC

## 2013-12-13 NOTE — Telephone Encounter (Signed)
Encourage liquids , amox 500 bid for ten d, tess perles 100 one q8prn cough

## 2013-12-13 NOTE — Telephone Encounter (Signed)
meds sent to pharm. Pt notified.  

## 2013-12-13 NOTE — Telephone Encounter (Signed)
Patient states she is wheezing, coughing, dizzy and feeling weak. Started last night. No fever.  Seen on 11/14/13 for dizziness and a med check up. Advised patient she needs to go to urgent care or ED because we have no openings left today. Patient states she doesn't have anyone to take her and she doesn't feel like getting up and getting ready. Would like to know if something can be called to [facility] to be delivered.

## 2013-12-24 ENCOUNTER — Other Ambulatory Visit (INDEPENDENT_AMBULATORY_CARE_PROVIDER_SITE_OTHER): Payer: Self-pay | Admitting: Internal Medicine

## 2013-12-28 ENCOUNTER — Other Ambulatory Visit (INDEPENDENT_AMBULATORY_CARE_PROVIDER_SITE_OTHER): Payer: Self-pay | Admitting: Internal Medicine

## 2014-01-01 ENCOUNTER — Telehealth (INDEPENDENT_AMBULATORY_CARE_PROVIDER_SITE_OTHER): Payer: Self-pay | Admitting: *Deleted

## 2014-01-01 ENCOUNTER — Encounter (INDEPENDENT_AMBULATORY_CARE_PROVIDER_SITE_OTHER): Payer: Self-pay | Admitting: Internal Medicine

## 2014-01-01 ENCOUNTER — Encounter (INDEPENDENT_AMBULATORY_CARE_PROVIDER_SITE_OTHER): Payer: Self-pay | Admitting: *Deleted

## 2014-01-01 ENCOUNTER — Ambulatory Visit (INDEPENDENT_AMBULATORY_CARE_PROVIDER_SITE_OTHER): Payer: Medicare Other | Admitting: Internal Medicine

## 2014-01-01 VITALS — BP 120/68 | HR 72 | Temp 97.0°F | Resp 18 | Ht 64.0 in | Wt 145.4 lb

## 2014-01-01 DIAGNOSIS — R110 Nausea: Secondary | ICD-10-CM

## 2014-01-01 DIAGNOSIS — C189 Malignant neoplasm of colon, unspecified: Secondary | ICD-10-CM

## 2014-01-01 DIAGNOSIS — K769 Liver disease, unspecified: Secondary | ICD-10-CM

## 2014-01-01 DIAGNOSIS — Z0189 Encounter for other specified special examinations: Secondary | ICD-10-CM

## 2014-01-01 DIAGNOSIS — K7689 Other specified diseases of liver: Secondary | ICD-10-CM

## 2014-01-01 DIAGNOSIS — K219 Gastro-esophageal reflux disease without esophagitis: Secondary | ICD-10-CM

## 2014-01-01 DIAGNOSIS — K7460 Unspecified cirrhosis of liver: Secondary | ICD-10-CM

## 2014-01-01 MED ORDER — PREDNISONE 10 MG PO TABS: 50.0000 mg | ORAL_TABLET | Freq: Every day | ORAL | Status: AC

## 2014-01-01 MED ORDER — PANTOPRAZOLE SODIUM 40 MG PO TBEC
40.0000 mg | DELAYED_RELEASE_TABLET | Freq: Two times a day (BID) | ORAL | Status: AC
Start: 2014-01-01 — End: ?

## 2014-01-01 MED ORDER — ONDANSETRON 4 MG PO TBDP: 4.0000 mg | ORAL_TABLET | Freq: Two times a day (BID) | ORAL | Status: DC | PRN

## 2014-01-01 MED ORDER — LISINOPRIL 20 MG PO TABS: 40.0000 mg | ORAL_TABLET | Freq: Every day | ORAL | Status: AC

## 2014-01-01 NOTE — Patient Instructions (Addendum)
Physician will call with the results of blood work when received. Abdominal CT to be scheduled.

## 2014-01-01 NOTE — Telephone Encounter (Signed)
Per Dr.[name] the patient will need to have labs drawn. 

## 2014-01-01 NOTE — Progress Notes (Signed)
Presenting complaint;  Followup for cirrhosis and liver lesion.  Database;  Patient is 80 year old Caucasian female who has history of colonic adenomas. She had colonoscopy and [place] 2013 with removal of multiple adenomas including one from proximal transverse colon which revealed invasive adenocarcinoma. She was referred for surgical intervention. She had preop CT which revealed new diagnosis of cirrhosis and liver lesion. Patient was felt to be high risk for surgery. She has had normal CEAs. Regarding liver lesion alpha-fetoprotein levels have been normal. He has had multiple imaging studies. Last CT with contrast was in October 2014 revealing small hypervascular lesion in the right hepatic lobe suspicious for dysplastic nodule or small HCC. Liver contour consistent with cirrhosis and she has splenomegaly. She had surveillance colonoscopy on 12/05/2013 revealing no evidence of residual polyp or tumor at polypectomy site which was identified because of previous tattooing. She had sigmoid colon diverticulosis.  Subjective:  Patient is here for scheduled visit. She does not feel well. She complains of nausea without vomiting. She also complains of regurgitation when she stoops or bends. She also complains of feeling lightheaded. She just had hemodialysis this morning. She states her weight is close to her baseline weight. She does not have a good appetite. She also complains of abdominal bloating but denies  melena or rectal bleeding. She believes her last hemoglobin was around 11 g. She does complain of intermittent pain which starts in the vagina and radiates to the umbilicus. She denies vaginal bleeding. She sill pass some urine but doesn't have dysuria or hematuria. She denies fever or chills. She thought Sensipar was making her sick and did not take it for 3 days but cannot tell any difference.    Current Medications: Outpatient Encounter Prescriptions as of 01/01/2014  Medication Sig  .  acetaminophen (TYLENOL) 500 MG tablet Take 500 mg by mouth every 6 (six) hours as needed for pain.  Marland Kitchen allopurinol (ZYLOPRIM) 100 MG tablet Take 100 mg by mouth daily.   Marland Kitchen amLODipine (NORVASC) 5 MG tablet Take 5 mg by mouth at bedtime.   Marland Kitchen aspirin EC 81 MG tablet Take 81 mg by mouth daily.  . cinacalcet (SENSIPAR) 30 MG tablet Take 30 mg by mouth at bedtime.  Marland Kitchen FIBER FORMULA PO Take 30 mLs by mouth daily as needed (for constipation).   . folic acid-vitamin b complex-vitamin c-selenium-zinc (DIALYVITE) 3 MG TABS tablet Take 1 tablet by mouth daily.  . Heparin Lock Flush (HEPARIN FLUSH) 10 UNIT/ML injection 10 Units. Patient states that she gets this at the time of her dialysis  . lanthanum (FOSRENOL) 1000 MG chewable tablet Chew 1,000-2,000 mg by mouth 4 (four) times daily. Take 2 with meal and 1 with snacks  . lisinopril (PRINIVIL,ZESTRIL) 20 MG tablet Take 40 mg by mouth at bedtime.   Marland Kitchen omeprazole (PRILOSEC) 20 MG capsule Take 20 mg by mouth 2 (two) times daily.  . Vitamin D, Ergocalciferol, (DRISDOL) 50000 UNITS CAPS capsule Take 50,000 Units by mouth every 7 (seven) days. Wednesday  . [DISCONTINUED] amoxicillin (AMOXIL) 500 MG capsule Take 1 capsule (500 mg total) by mouth 3 (three) times daily.  . [DISCONTINUED] benzonatate (TESSALON PERLES) 100 MG capsule Take 1 capsule (100 mg total) by mouth 3 (three) times daily as needed for cough.    Objective: Blood pressure 120/68, pulse 72, temperature 97 F (36.1 C), temperature source Oral, resp. rate 18, height 5\' 4"  (1.626 m), weight 145 lb 6.4 oz (65.953 kg). Patient is alert and in no acute distress.  He had to be assisted to examination table. Conjunctiva is pink. Sclera is nonicteric Oropharyngeal mucosa is normal. No neck masses or thyromegaly noted. Cardiac exam with occasional irregularity, normal S1 and S2. Faint systolic ejection murmur at LLSB. Lungs are clear to auscultation. Abdomen is full. On palpation it is soft with mild  tenderness in LLQ. No organomegaly or masses. Shifting dullness is absent. She has trace edema around ankles.  Labs/studies Results: CEA was 4.8 on 07/10/2013.  AFP was 4.5 on 07/10/2013.  Assessment:  #1. Nausea and regurgitation. She may have an element of gastroparesis given chronic kidney disease with need for dialysis. She is not candidate for metoclopramide. Will try her on different PPI. #2. Weakness possibly multifactorial. Blood pressure is normal. Will check serum ammonia to make sure she does not have subclinical HE. #3. History of invasive carcinoma in polyp at transverse colon initially discovered in February 2013. Surgery not undertaken because of high risk. Recent colonoscopy negative for local recurrence and CEA levels have been normal. #4. Single hypervascular liver lesion in a patient with cirrhosis. AFP levels have been normal. Abdominal exam reveals him distention but shifting dullness. She is due for CT which would also tell us if she has developed ascites.   Plan:  Discontinue omeprazole. Begin pantoprazole 40 mg by mouth twice a day. Patient will have following lab studies at the time of next hemodialysis i.e. LFTs, AFP, CEA and serum ammonia. New prescription given for ondansetron 4 mg by mouth twice a day when necessary. Patient was also given a prescription for lisinopril(am not sure why my name is listed on this prescription). Liver protocol is abdominal CT. Office visit in 6 months

## 2014-01-08 ENCOUNTER — Other Ambulatory Visit (INDEPENDENT_AMBULATORY_CARE_PROVIDER_SITE_OTHER): Payer: Self-pay | Admitting: Internal Medicine

## 2014-01-08 ENCOUNTER — Telehealth (INDEPENDENT_AMBULATORY_CARE_PROVIDER_SITE_OTHER): Payer: Self-pay | Admitting: *Deleted

## 2014-01-08 LAB — HEPATIC FUNCTION PANEL
ALK PHOS: 267 U/L — AB (ref 39–117)
ALT: 14 U/L (ref 0–35)
AST: 21 U/L (ref 0–37)
Albumin: 3.4 g/dL — ABNORMAL LOW (ref 3.5–5.2)
BILIRUBIN INDIRECT: 0.2 mg/dL (ref 0.2–1.2)
Bilirubin, Direct: 0.2 mg/dL (ref 0.0–0.3)
TOTAL PROTEIN: 6.4 g/dL (ref 6.0–8.3)
Total Bilirubin: 0.4 mg/dL (ref 0.3–1.2)

## 2014-01-08 LAB — AMMONIA: AMMONIA: 42 umol/L (ref 11–60)

## 2014-01-08 NOTE — Telephone Encounter (Signed)
We already have recent creat for CT, these are labs Dr Laural Golden requested from [place]

## 2014-01-08 NOTE — Telephone Encounter (Signed)
Noted  

## 2014-01-08 NOTE — Telephone Encounter (Signed)
[name] from [facility] called and said Julia Love brought her lab order sheet with her to have drawn. [name] was not sure of what tubes to draw so we may not get all the labs needed for her apt tomorrow, 01/09/14.

## 2014-01-09 ENCOUNTER — Encounter (HOSPITAL_COMMUNITY): Payer: Self-pay

## 2014-01-09 ENCOUNTER — Ambulatory Visit (HOSPITAL_COMMUNITY)
Admission: RE | Admit: 2014-01-09 | Discharge: 2014-01-09 | Disposition: A | Discharge status: Home / Self Care | Payer: Medicare Other | Source: Ambulatory Visit | Attending: Internal Medicine | Admitting: Internal Medicine

## 2014-01-09 DIAGNOSIS — K7689 Other specified diseases of liver: Secondary | ICD-10-CM | POA: Insufficient documentation

## 2014-01-09 DIAGNOSIS — R161 Splenomegaly, not elsewhere classified: Secondary | ICD-10-CM | POA: Insufficient documentation

## 2014-01-09 DIAGNOSIS — R188 Other ascites: Secondary | ICD-10-CM | POA: Insufficient documentation

## 2014-01-09 DIAGNOSIS — R110 Nausea: Secondary | ICD-10-CM

## 2014-01-09 DIAGNOSIS — K769 Liver disease, unspecified: Secondary | ICD-10-CM

## 2014-01-09 DIAGNOSIS — C189 Malignant neoplasm of colon, unspecified: Secondary | ICD-10-CM | POA: Insufficient documentation

## 2014-01-09 DIAGNOSIS — K7460 Unspecified cirrhosis of liver: Secondary | ICD-10-CM | POA: Insufficient documentation

## 2014-01-09 LAB — CEA: CEA: 6.7 ng/mL — AB (ref 0.0–5.0)

## 2014-01-09 LAB — AFP TUMOR MARKER: AFP-Tumor Marker: 5.2 ng/mL (ref 0.0–8.0)

## 2014-01-09 MED ORDER — IOHEXOL 300 MG/ML  SOLN
100.0000 mL | Freq: Once | INTRAMUSCULAR | Status: AC | PRN
  Administered 2014-01-09: 100 mL via INTRAVENOUS

## 2014-01-15 ENCOUNTER — Telehealth (INDEPENDENT_AMBULATORY_CARE_PROVIDER_SITE_OTHER): Payer: Self-pay | Admitting: *Deleted

## 2014-01-15 NOTE — Telephone Encounter (Signed)
Patient,s call returned and I went over the results of tests with her. If ascites becomes TENS she will need abdominal tap.

## 2014-01-15 NOTE — Telephone Encounter (Signed)
Dr.[name] Ms. Ruppel wants to hear the results from you. States that she would appreciate hearing from you,though you had already talked with her daughter.  Notes Recorded by Rogene Houston, MD on 01/13/2014 at 8:38 PM All results have been reviewed with patient's daughter Lenell Antu. Patient has developed ascites which implies worsening liver disease. Alkaline phosphatase is gradually going up; this could be secondary to bone disease to 2 end-stage renal disease or cirrhosis. CEA is mildly elevated but it may be because of impaired clearance for cirrhosis. Suspicious 7 mm liver lesion has not changed. If patient remains with nausea we'll consider EGD and gastric emptying study. Patient's daughter will call with progress report

## 2014-01-15 NOTE — Telephone Encounter (Signed)
Julia Love would like to know if the cyst on right side showed up on the CT Scan. If so, would he please give her a return call with the results. She knows he spoke with her daughter but would like to know also. The return phone number is [phone].

## 2014-01-16 NOTE — Telephone Encounter (Signed)
Patients call returned.

## 2014-01-28 ENCOUNTER — Telehealth (INDEPENDENT_AMBULATORY_CARE_PROVIDER_SITE_OTHER): Payer: Self-pay | Admitting: *Deleted

## 2014-01-28 NOTE — Telephone Encounter (Signed)
Forwarded to Dr.[name] to review.

## 2014-01-28 NOTE — Telephone Encounter (Signed)
Try Zofran 8 mg by mouth twice a day when necessary;

## 2014-01-28 NOTE — Telephone Encounter (Signed)
Julia Love is very nauseated and would like something to help her with this. Zofran doesn't work and Phenergan makes her sleep all the time. Her return phone number is [phone].

## 2014-01-29 NOTE — Telephone Encounter (Signed)
I have called the patient and left a message with Dr.[name]'s recommendation. Patient advised to call us back if we needed to call in a prescription for her.

## 2014-01-30 ENCOUNTER — Telehealth (INDEPENDENT_AMBULATORY_CARE_PROVIDER_SITE_OTHER): Payer: Self-pay | Admitting: *Deleted

## 2014-01-30 NOTE — Telephone Encounter (Signed)
Patient states that she has had loose stools since she drank the contrast for her recent CT. Per Dr.[name] the patient may take Imodium 2 mg - Take 1 by mouth in the morning ,and a second dose only as needed. Patient not to exceed 2 a day. Patient was called and made aware.

## 2014-01-30 NOTE — Telephone Encounter (Signed)
Patient was called and made aware that medication had been called to [facility]. They will deliver tomorrow afternoon, as the patient has Dialysis tomorrow.

## 2014-02-11 ENCOUNTER — Telehealth (INDEPENDENT_AMBULATORY_CARE_PROVIDER_SITE_OTHER): Payer: Self-pay | Admitting: *Deleted

## 2014-02-11 NOTE — Telephone Encounter (Signed)
Talked with patient. She will stop Zofran and will proceed with EGD as soon as possible

## 2014-02-11 NOTE — Telephone Encounter (Signed)
Voice mail left Friday 3:54 pm  Would like Tammy to call her please

## 2014-02-11 NOTE — Telephone Encounter (Signed)
Patient called. She states that the Zofran 4 mg is making her very sleepy , last dose was Saturday. Patient states that she cannot eat, when she attempts to it is no more that 3 bites of food. Some of those times she is throwing the food up. Drinking fluids no more than 32 ounces ,due to Dialysis. Most of this started after she had the CT. Dr.Powell states told the patient that this was not due to her kidney problems. Weight is about the same per the patient .

## 2014-02-12 ENCOUNTER — Other Ambulatory Visit (INDEPENDENT_AMBULATORY_CARE_PROVIDER_SITE_OTHER): Payer: Self-pay | Admitting: *Deleted

## 2014-02-12 DIAGNOSIS — R112 Nausea with vomiting, unspecified: Secondary | ICD-10-CM

## 2014-02-12 NOTE — Telephone Encounter (Signed)
EGD sch'd 02/15/14, patient aware

## 2014-02-12 NOTE — Telephone Encounter (Signed)
Left message asking patient to call me to schedule EGD

## 2014-02-15 ENCOUNTER — Encounter (HOSPITAL_COMMUNITY): Payer: Self-pay | Admitting: *Deleted

## 2014-02-15 ENCOUNTER — Encounter (HOSPITAL_COMMUNITY)
Admission: RE | Disposition: A | Discharge status: Home / Self Care | Payer: Self-pay | Source: Ambulatory Visit | Attending: Internal Medicine

## 2014-02-15 ENCOUNTER — Ambulatory Visit (HOSPITAL_COMMUNITY)
Admission: RE | Admit: 2014-02-15 | Discharge: 2014-02-15 | Disposition: A | Discharge status: Home / Self Care | Payer: Medicare Other | Source: Ambulatory Visit | Attending: Internal Medicine | Admitting: Internal Medicine

## 2014-02-15 DIAGNOSIS — K449 Diaphragmatic hernia without obstruction or gangrene: Secondary | ICD-10-CM | POA: Insufficient documentation

## 2014-02-15 DIAGNOSIS — K219 Gastro-esophageal reflux disease without esophagitis: Secondary | ICD-10-CM | POA: Insufficient documentation

## 2014-02-15 DIAGNOSIS — R112 Nausea with vomiting, unspecified: Secondary | ICD-10-CM

## 2014-02-15 DIAGNOSIS — Z7982 Long term (current) use of aspirin: Secondary | ICD-10-CM | POA: Insufficient documentation

## 2014-02-15 DIAGNOSIS — Z91041 Radiographic dye allergy status: Secondary | ICD-10-CM | POA: Insufficient documentation

## 2014-02-15 DIAGNOSIS — M109 Gout, unspecified: Secondary | ICD-10-CM | POA: Insufficient documentation

## 2014-02-15 DIAGNOSIS — Z79899 Other long term (current) drug therapy: Secondary | ICD-10-CM | POA: Insufficient documentation

## 2014-02-15 DIAGNOSIS — K7460 Unspecified cirrhosis of liver: Secondary | ICD-10-CM | POA: Insufficient documentation

## 2014-02-15 DIAGNOSIS — Z860100 Personal history of colon polyps, unspecified: Secondary | ICD-10-CM | POA: Insufficient documentation

## 2014-02-15 DIAGNOSIS — I120 Hypertensive chronic kidney disease with stage 5 chronic kidney disease or end stage renal disease: Secondary | ICD-10-CM | POA: Insufficient documentation

## 2014-02-15 DIAGNOSIS — Z992 Dependence on renal dialysis: Secondary | ICD-10-CM | POA: Insufficient documentation

## 2014-02-15 DIAGNOSIS — Z87891 Personal history of nicotine dependence: Secondary | ICD-10-CM | POA: Insufficient documentation

## 2014-02-15 DIAGNOSIS — R630 Anorexia: Secondary | ICD-10-CM

## 2014-02-15 DIAGNOSIS — N186 End stage renal disease: Secondary | ICD-10-CM | POA: Insufficient documentation

## 2014-02-15 DIAGNOSIS — Z885 Allergy status to narcotic agent status: Secondary | ICD-10-CM | POA: Insufficient documentation

## 2014-02-15 DIAGNOSIS — Z905 Acquired absence of kidney: Secondary | ICD-10-CM | POA: Insufficient documentation

## 2014-02-15 DIAGNOSIS — K766 Portal hypertension: Secondary | ICD-10-CM

## 2014-02-15 DIAGNOSIS — K319 Disease of stomach and duodenum, unspecified: Secondary | ICD-10-CM

## 2014-02-15 HISTORY — PX: ESOPHAGOGASTRODUODENOSCOPY: SHX5428

## 2014-02-15 SURGERY — EGD (ESOPHAGOGASTRODUODENOSCOPY)
Anesthesia: Moderate Sedation

## 2014-02-15 MED ORDER — MIDAZOLAM HCL 5 MG/5ML IJ SOLN
INTRAMUSCULAR | Status: DC | PRN
  Administered 2014-02-15 (×2): 1 mg via INTRAVENOUS
  Administered 2014-02-15: 2 mg via INTRAVENOUS

## 2014-02-15 MED ORDER — BUTAMBEN-TETRACAINE-BENZOCAINE 2-2-14 % EX AERO
INHALATION_SPRAY | CUTANEOUS | Status: DC | PRN
  Administered 2014-02-15: 2 via TOPICAL

## 2014-02-15 MED ORDER — MIDAZOLAM HCL 5 MG/5ML IJ SOLN
INTRAMUSCULAR | Status: AC
  Filled 2014-02-15: qty 10

## 2014-02-15 MED ORDER — SODIUM CHLORIDE 0.9 % IV SOLN
INTRAVENOUS | Status: DC
  Administered 2014-02-15: 11:00:00 via INTRAVENOUS

## 2014-02-15 MED ORDER — MEPERIDINE HCL 50 MG/ML IJ SOLN
INTRAMUSCULAR | Status: DC | PRN
  Administered 2014-02-15: 25 mg via INTRAVENOUS

## 2014-02-15 MED ORDER — STERILE WATER FOR IRRIGATION IR SOLN
Status: DC | PRN
  Administered 2014-02-15: 12:00:00

## 2014-02-15 MED ORDER — MEPERIDINE HCL 50 MG/ML IJ SOLN
INTRAMUSCULAR | Status: AC
  Filled 2014-02-15: qty 1

## 2014-02-15 NOTE — Discharge Instructions (Signed)
Resume usual medications and diet. No driving for 24 hours. Physician will call with biopsy results.  Esophagogastroduodenoscopy Esophagogastroduodenoscopy (EGD) is a procedure to examine the lining of the esophagus, stomach, and first part of the small intestine (duodenum). A long, flexible, lighted tube with a camera attached (endoscope) is inserted down the throat to view these organs. This procedure is done to detect problems or abnormalities, such as inflammation, bleeding, ulcers, or growths, in order to treat them. The procedure lasts about 5 20 minutes. It is usually an outpatient procedure, but it may need to be performed in emergency cases in the hospital. LET YOUR CAREGIVER KNOW ABOUT:   Allergies to food or medicine.  All medicines you are taking, including vitamins, herbs, eyedrops, and over-the-counter medicines and creams.  Use of steroids (by mouth or creams).  Previous problems you or members of your family have had with the use of anesthetics.  Any blood disorders you have.  Previous surgeries you have had.  Other health problems you have.  Possibility of pregnancy, if this applies. RISKS AND COMPLICATIONS  Generally, EGD is a safe procedure. However, as with any procedure, complications can occur. Possible complications include:  Infection.  Bleeding.  Tearing (perforation) of the esophagus, stomach, or duodenum.  Difficulty breathing or not being able to breath.  Excessive sweating.  Spasms of the larynx.  Slowed heartbeat.  Low blood pressure. BEFORE THE PROCEDURE  Do not eat or drink anything for 6 8 hours before the procedure or as directed by your caregiver.  Ask your caregiver about changing or stopping your regular medicines.  If you wear dentures, be prepared to remove them before the procedure.  Arrange for someone to drive you home after the procedure. PROCEDURE   A vein will be accessed to give medicines and fluids. A medicine to relax  you (sedative) and a pain reliever will be given through that access into the vein.  A numbing medicine (local anesthetic) may be sprayed on your throat for comfort and to stop you from gagging or coughing.  A mouth guard may be placed in your mouth to protect your teeth and to keep you from biting on the endoscope.  You will be asked to lie on your left side.  The endoscope is inserted down your throat and into the esophagus, stomach, and duodenum.  Air is put through the endoscope to allow your caregiver to view the lining of your esophagus clearly.  The esophagus, stomach, and duodenum is then examined. During the exam, your caregiver may:  Remove tissue to be examined under a microscope (biopsy) for inflammation, infection, or other medical problems.  Remove growths.  Remove objects (foreign bodies) that are stuck.  Treat any bleeding with medicines or other devices that stop tissues from bleeding (hot cauters, clipping devices).  Widen (dilate) or stretch narrowed areas of the esophagus and stomach.  The endoscope will then be withdrawn. AFTER THE PROCEDURE  You will be taken to a recovery area to be monitored. You will be able to go home once you are stable and alert.  Do not eat or drink anything until the local anesthetic and numbing medicines have worn off. You may choke.  It is normal to feel bloated, have pain with swallowing, or have a sore throat for a short time. This will wear off.  Your caregiver should be able to discuss his or her findings with you. It will take longer to discuss the test results if any biopsies were taken.

## 2014-02-15 NOTE — H&P (Signed)
Julia Love is an 80 y.o. female.   Chief Complaint: Patient is here for EGD. HPI: Patient is a 31-year-old Caucasian female with multiple medical problems including end-stage renal disease on hemodialysis, history of cirrhosis who presents with nausea frequent regurgitation and sporadic vomiting. Last episode was 2 days ago after she ate sandwich from [place]. She has poor appetite and has lost a few pounds. She denies dysphagia hematemesis melena or rectal bleeding. She states her nausea started when she tried contrast for CT on 01/09/2014. She has not gotten any relief with ondansetron and she has been on PPI chronically. Regarding cirrhosis she has a liver lesion which is being followed closely and has been stable. She had polyp removed from her transverse colon in February 2013 which revealed adenoma with invasive carcinoma. Surgery was postponed when she was discovered to have cirrhosis. CEA has remained normal and her last colonoscopy was in March this year revealing no local recurrence.    Past Medical History  Diagnosis Date  . S/P tonsillectomy     1945  . History of nephrectomy     (right) for a non-functioning kidney in 1949  . S/P complete hysterectomy     1990  . Hypertension     for several years  . Fistula     rt arm for dialysis which is not in use at present  . Hemodialysis patient     since November 2005  . Gastric ulcer     back in 1989  . Parathyroid disorder     surgery in August 2011  . Rectocele     repair several years ago  . GERD (gastroesophageal reflux disease)   . S/P laparoscopic cholecystectomy   . Cancer     Colon cancer  . ESRD (end stage renal disease) on dialysis   . Shortness of breath     with exertion  . Heart murmur     per Dr Verl Blalock  . H/O hiatal hernia   . Gout     Past Surgical History  Procedure Laterality Date  . Abdominal hysterectomy  1990  . Colonoscopy  10/20/2011    Procedure: COLONOSCOPY;  Surgeon: Rogene Houston, MD;   Location: [facility];  Service: Endoscopy;  Laterality: N/A;  100  . Esophagogastroduodenoscopy  10/20/2011    Procedure: ESOPHAGOGASTRODUODENOSCOPY (EGD);  Surgeon: Rogene Houston, MD;  Location: [facility];  Service: Endoscopy;  Laterality: N/A;  Possible EGD  . Laparoscopic nephrectomy  1949  . Eye surgery  2009, 2005    bilateral  . Dg av dialysis graft declot or    . Cholecystectomy  08  . Parathyroidectomy  07/07/09  . Rectocele repair    . Tonsillectomy    . Colonoscopy  11/05/2011    Procedure: COLONOSCOPY;  Surgeon: Rogene Houston, MD;  Location: [facility];  Service: Endoscopy;  Laterality: N/A;  730  . Thrombectomy and revision of arterioventous (av) goretex  graft Right 11/23/2012    Procedure: THROMBECTOMY AND REVISION OF RIGHT ARM ARTERIOVENTOUS   GRAFT USING 7MM X 10CM THIN WALL STRETCH GORETEX GRAFT.;  Surgeon: Conrad , MD;  Location: [facility];  Service: Vascular;  Laterality: Right;  . Thrombectomy and revision of arterioventous (av) goretex  graft Right 12/01/2012    Procedure: THROMBECTOMY AND REVISION OF ARTERIOVENTOUS (AV) GORETEX  GRAFT;  Surgeon: Mal Misty, MD;  Location: [facility];  Service: Vascular;  Laterality: Right;  . Yag laser application Left 9/41/7408  Procedure: YAG LASER APPLICATION;  Surgeon: Elta Guadeloupe T. Gershon Crane, MD;  Location: [facility];  Service: Ophthalmology;  Laterality: Left;  . Yag laser application Right 02/17/3709    Procedure: YAG LASER APPLICATION;  Surgeon: Elta Guadeloupe T. Gershon Crane, MD;  Location: [facility];  Service: Ophthalmology;  Laterality: Right;  . Colonoscopy N/A 12/05/2013    Procedure: COLONOSCOPY;  Surgeon: Rogene Houston, MD;  [facility];  Service: Endoscopy;  Laterality: N/A;  1200    Family History  Problem Relation Age of Onset  . Hypertension Mother   . Stroke Mother   . Hypertension Father   . Stroke Father   . Hypertension Daughter   . Hypertension Daughter   . Healthy Son   . Colon cancer Neg Hx    Social  History:  reports that she quit smoking about 27 years ago. Her smoking use included Cigarettes. She has a 15 pack-year smoking history. She has never used smokeless tobacco. She reports that she does not drink alcohol or use illicit drugs.  Allergies:  Allergies  Allergen Reactions  . Vicodin [Hydrocodone-Acetaminophen] Other (See Comments)    Makes sleep for long periods of time  . Contrast Media [Iodinated Diagnostic Agents] Itching    Medications Prior to Admission  Medication Sig Dispense Refill  . acetaminophen (TYLENOL) 500 MG tablet Take 500 mg by mouth every 6 (six) hours as needed for pain.      Marland Kitchen allopurinol (ZYLOPRIM) 100 MG tablet Take 100 mg by mouth daily.       Marland Kitchen amLODipine (NORVASC) 5 MG tablet Take 5 mg by mouth at bedtime.       . cinacalcet (SENSIPAR) 30 MG tablet Take 30 mg by mouth at bedtime.      Marland Kitchen FIBER FORMULA PO Take 30 mLs by mouth daily as needed (for constipation).       . folic acid-vitamin b complex-vitamin c-selenium-zinc (DIALYVITE) 3 MG TABS tablet Take 1 tablet by mouth daily.      Marland Kitchen lanthanum (FOSRENOL) 1000 MG chewable tablet Chew 1,000-2,000 mg by mouth 4 (four) times daily. Take 2 with meal and 1 with snacks      . lisinopril (PRINIVIL,ZESTRIL) 20 MG tablet Take 2 tablets (40 mg total) by mouth at bedtime.  60 tablet  5  . pantoprazole (PROTONIX) 40 MG tablet Take 1 tablet (40 mg total) by mouth 2 (two) times daily before a meal.  60 tablet  5  . predniSONE (DELTASONE) 10 MG tablet Take 5 tablets (50 mg total) by mouth daily with breakfast.  15 tablet  0  . Vitamin D, Ergocalciferol, (DRISDOL) 50000 UNITS CAPS capsule Take 50,000 Units by mouth every 7 (seven) days. Wednesday      . aspirin EC 81 MG tablet Take 81 mg by mouth daily.      . Heparin Lock Flush (HEPARIN FLUSH) 10 UNIT/ML injection 10 Units. Patient states that she gets this at the time of her dialysis      . ondansetron (ZOFRAN-ODT) 4 MG disintegrating tablet Take 1 tablet (4 mg total) by  mouth 2 (two) times daily as needed for nausea or vomiting.  20 tablet  1    No results found for this or any previous visit (from the past 48 hour(s)). No results found.  ROS  Blood pressure 129/63, temperature 97.7 F (36.5 C), temperature source Oral, resp. rate 20, height 5\' 4"  (1.626 m), weight 145 lb (65.772 kg), SpO2 92.00%. Physical Exam  Constitutional: She appears well-developed  and well-nourished.  HENT:  Mouth/Throat: Oropharynx is clear and moist.  Eyes: Conjunctivae are normal. No scleral icterus.  Neck: No thyromegaly present.  Cardiovascular: Normal rate, regular rhythm and normal heart sounds.   Murmur: grade 2/6 systolic ejection murmur at aortic area. Respiratory: Effort normal and breath sounds normal.  GI:  Abdomen is full soft and nontender without organomegaly or masses  Musculoskeletal: She exhibits edema (trace edema around the ankles).  Lymphadenopathy:    She has no cervical adenopathy.  Neurological: She is alert.  Skin: Skin is warm and dry.     Assessment/Plan Recurrent nausea and anorexia unresponsive to therapy. Known cirrhosis. Diagnostic EGD.  Socorro Ebron U Kemesha Mosey 02/15/2014, 11:55 AM

## 2014-02-15 NOTE — Op Note (Signed)
EGD PROCEDURE REPORT  PATIENT:  Julia Love  MR#:  [id number] Birthdate:  [birth year], 80 y.o., female Endoscopist:  Dr. Rogene Houston, MD Procedure Date: 02/15/2014  Procedure:   EGD  Indications:  Patient is an 80 year old Caucasian female with multiple medical problems who presents with intractable nausea sporadic vomiting unresponsive to therapy.   Informed Consent:  The risks, benefits, alternatives & imponderables which include, but are not limited to, bleeding, infection, perforation, drug reaction and potential missed lesion have been reviewed.  The potential for biopsy, lesion removal, esophageal dilation, etc. have also been discussed.  Questions have been answered.  All parties agreeable.  Please see history & physical in medical record for more information.  Medications:  Demerol 25 mg IV Versed 4 mg IV Cetacaine spray topically for oropharyngeal anesthesia  Description of procedure:  The endoscope was introduced through the mouth and advanced to the second portion of the duodenum without difficulty or limitations. The mucosal surfaces were surveyed very carefully during advancement of the scope and upon withdrawal.  Findings:  Esophagus:   Mucosa of the esophagus was normal. GE junction was unremarkable. Esophageal varices are not present. GEJ:  38 cm Hiatus:  40 cm Stomach:  Stomach was empty and distended very well with insufflation. Folds in the proximal stomach were normal. Examination mucosa revealed mosaic pattern at gastric body and erythema and nodularity in the prepyloric region. Pyloric channel was patent. Angularis fundus and cardia were examined by retroflexion of scope and were unremarkable.  Duodenum:  Proximal bulbar mucosa was normal. Distal bulbar mucosa revealed edema erythema and these changes extended to angle of the duodenum and just beyond. Biopsy was taken for routine histology.   Therapeutic/Diagnostic Maneuvers Performed:   see above    Complications:   none  Impression: Small sliding hiatal hernia without changes of esophagitis. Portal gastropathy without evidence of esophageal or gastric varices  Abnormal appearance to bulbar and post bulbar mucosa possibly due to portal hypertension. Biopsy taken for routine histology  Recommendations:  Standard instructions given. I will be contacting patient biopsy results. If biopsy is unremarkable will proceed with solid phase gastric emptying study.  Rogene Houston  02/15/2014  12:25 PM  CC: Dr. Rubbie Battiest, MD & Dr. Rayne Du ref. provider found

## 2014-02-20 ENCOUNTER — Other Ambulatory Visit (INDEPENDENT_AMBULATORY_CARE_PROVIDER_SITE_OTHER): Payer: Self-pay | Admitting: Internal Medicine

## 2014-02-20 ENCOUNTER — Encounter (HOSPITAL_COMMUNITY): Payer: Self-pay | Admitting: Internal Medicine

## 2014-02-20 DIAGNOSIS — R112 Nausea with vomiting, unspecified: Secondary | ICD-10-CM

## 2014-02-22 ENCOUNTER — Encounter (HOSPITAL_COMMUNITY)
Admission: RE | Admit: 2014-02-22 | Discharge: 2014-02-22 | Disposition: A | Discharge status: Home / Self Care | Payer: Medicare Other | Source: Ambulatory Visit | Attending: Internal Medicine | Admitting: Internal Medicine

## 2014-02-22 ENCOUNTER — Encounter (HOSPITAL_COMMUNITY): Payer: Self-pay

## 2014-02-22 DIAGNOSIS — R112 Nausea with vomiting, unspecified: Secondary | ICD-10-CM | POA: Insufficient documentation

## 2014-02-22 MED ORDER — TECHNETIUM TC 99M SULFUR COLLOID
2.0000 | Freq: Once | INTRAVENOUS | Status: AC | PRN
  Administered 2014-02-22: 2 via INTRAVENOUS

## 2014-02-27 ENCOUNTER — Telehealth (INDEPENDENT_AMBULATORY_CARE_PROVIDER_SITE_OTHER): Payer: Self-pay | Admitting: *Deleted

## 2014-02-27 NOTE — Telephone Encounter (Signed)
Message was forwarded to [place].

## 2014-02-27 NOTE — Telephone Encounter (Signed)
Patient states that she just does not feel good at all,just terrible. Nausea is really bad. Cannot eat. Nausea medication is not working. Lost 7-8 pounds since her last OV with  [place]. She states that since [place] ask her to talk with the Nephrologist about the meds , Fosrenol and Sensipar. She stopped all medications but her B/P medication. Patient will see her Nephrologist tomorrow when she has Dialysis.   She ask that Dr.[name] be made aware and if he had any other recommendations.

## 2014-02-27 NOTE — Telephone Encounter (Signed)
Patient left message wanting to speak with Tammy, she is still having nausea and vomiting -- please call [phone]

## 2014-03-01 NOTE — Telephone Encounter (Signed)
Patient states Norvasc and Sensipar has been discontinued; I will talk with Dr. Erling Cruz next used

## 2014-03-14 ENCOUNTER — Encounter: Payer: Self-pay | Admitting: Family Medicine

## 2014-03-14 ENCOUNTER — Ambulatory Visit (INDEPENDENT_AMBULATORY_CARE_PROVIDER_SITE_OTHER): Payer: Medicare Other | Admitting: Family Medicine

## 2014-03-14 VITALS — BP 94/64 | Temp 97.6°F | Ht 64.0 in | Wt 135.0 lb

## 2014-03-14 DIAGNOSIS — R110 Nausea: Secondary | ICD-10-CM

## 2014-03-14 MED ORDER — ONDANSETRON 8 MG PO TBDP: 8.0000 mg | ORAL_TABLET | Freq: Two times a day (BID) | ORAL | Status: AC | PRN

## 2014-03-14 MED ORDER — RANITIDINE HCL 150 MG PO TABS: ORAL_TABLET | ORAL | Status: AC

## 2014-03-14 NOTE — Progress Notes (Signed)
   Subjective:    Patient ID: Julia Love, female    DOB: Jan 13, 1929, 80 y.o.   MRN: [id number]  HPI Comments: Dr. Dereck Leep has done tests on her,which came back WNL according to pt.  Abdominal Pain This is a new problem. The current episode started more than 1 month ago (2 months ago). The problem occurs intermittently. The problem has been waxing and waning. The pain is located in the generalized abdominal region. The quality of the pain is sharp. The abdominal pain does not radiate. Associated symptoms include anorexia, nausea and vomiting. Associated symptoms comments: Pedal edema. The pain is aggravated by eating. The pain is relieved by being still. Pt on dialysis   Patient presents to the office today. Ongoing nausea. Patient frustrated by this. Has had a rather thorough workup via Dr. Laural Golden, frustrated that she has ongoing symptomatology.  Patient reports having diminished energy.  When questioned, patient denies any depression or mood difficulties. Intermittent burping and belching and in post prandial upper abdominal symptoms  Review of Systems  Gastrointestinal: Positive for nausea, vomiting, abdominal pain and anorexia.       Objective:   Physical Exam  Alert mild malaise. No acute distress. Talkative when queried. Vital stable. Blood pressure lowers 287 systolic over 70 hydration adequate. HEENT normal. Chronically ill appearing. Lungs clear. Heart regular in rhythm. Mild epigastric tenderness noted.   All hospital and chest records reviewed in presents the patient.     Assessment & Plan:  Impression nausea challenging for this patient I think likely both physical and mental components. Proton pump inhibitors were stopped due to possibility of adding to symptoms. Patient has had more reflux symptoms occur sense. Plan initiate Zantac at appropriate dose for dialysis. Patient advised to followup with GI Dr. Harley Alto will also follow her regularly here. Over 35 minutes spent in  the room most in discussion. And analysis of records. WSL

## 2014-03-19 ENCOUNTER — Telehealth (INDEPENDENT_AMBULATORY_CARE_PROVIDER_SITE_OTHER): Payer: Self-pay | Admitting: *Deleted

## 2014-03-19 NOTE — Telephone Encounter (Signed)
Patient has now lost 8 lbs, Dr. Wolfgang Phoenix started [place] on Zantac 03/14/14 and she has gotten a mild amount of relief, still have the nausea and vomiting symptoms with no apatite, Nephrologist has made no changes to the plan of care. Hillery Jacks Gilboy's return phone number is [phone].

## 2014-03-20 ENCOUNTER — Encounter (INDEPENDENT_AMBULATORY_CARE_PROVIDER_SITE_OTHER): Payer: Self-pay | Admitting: *Deleted

## 2014-03-20 NOTE — Telephone Encounter (Signed)
I returned the call of Alisa , homehealth nurse. I left a message that Dr.[name] had recommended that the patient take 8 mg of the Zofran in the mornings. To see if this would help the nausea and vomiting. I ask that she call me back if she had any other questions .

## 2014-03-22 ENCOUNTER — Telehealth (INDEPENDENT_AMBULATORY_CARE_PROVIDER_SITE_OTHER): Payer: Self-pay | Admitting: *Deleted

## 2014-03-22 NOTE — Telephone Encounter (Signed)
At 10:02 am I rec'd a call from [place]. She called the patient to make her aware of Dr.[name]'s plan. Julia Love told her that she and her family member talked with the Nephrologist yesterday as he was in house when she went for treatment. Patient has decided that she wanted to stop dialysis and have hospice called in. Hospice to go out today and talk with the patient and her family. Julia Love ask that Dr.[name] is told and express her appreciation for all his care and concern. Julia Love plans to follow her in the next couple of weeks to see how things are goiong.

## 2014-03-22 NOTE — Telephone Encounter (Signed)
Delrae Rend, home health nurse was called this morning,and made aware of Dr.[name]'s plan. She had sent information about the patient and her being nauseated. Dr.[name] had recommended that she take Zofran 8 mg in the morning to see if this would help with the nausea. Patient was seen by PCP,Dr. Wolfgang Phoenix on 03/14/14. At that time he wrote her a prescription for Zofran 8 mg take 1 by mouth twice a day. This has not helped the patient with her nausea. Per Dr. Laural Golden on 03/21/14 , will call Nephrologist and discuss this with him.

## 2014-03-27 NOTE — Telephone Encounter (Signed)
Called to talk with patient,s Nephrologist but patient has decided to stop hemodialysis

## 2014-04-09 ENCOUNTER — Telehealth: Payer: Self-pay | Admitting: Vascular Surgery

## 2014-04-09 NOTE — Telephone Encounter (Signed)
Julia Love called to let us know that her mom passed away last Thursday, 04-24-14. She does not have any appointments that we need to cancel.  Thanks, Ebony Hail

## 2014-04-13 DEATH — deceased

## 2014-04-15 NOTE — Telephone Encounter (Signed)
Confirmed date of death was 04/16/2014 per obituary.

## 2014-07-08 ENCOUNTER — Ambulatory Visit (INDEPENDENT_AMBULATORY_CARE_PROVIDER_SITE_OTHER): Payer: Medicare Other | Admitting: Internal Medicine

## 2014-07-15 ENCOUNTER — Encounter: Payer: Self-pay | Admitting: Family Medicine

## 2014-08-13 NOTE — Telephone Encounter (Signed)
Patient status changed to deceased in [facility]. Julia Love

## 2014-08-22 ENCOUNTER — Encounter (HOSPITAL_COMMUNITY): Payer: Self-pay | Admitting: Vascular Surgery

## 2014-08-30 ENCOUNTER — Encounter (INDEPENDENT_AMBULATORY_CARE_PROVIDER_SITE_OTHER): Payer: Self-pay

## 2016-03-15 IMAGING — CT CT ABDOMEN WO/W CM
3 of 9 series · 10 of 46 positions shown, 16 images · IV contrast (Omnipaque 300)
Comparison: 06/27/2013

CLINICAL DATA: Cirrhosis. Colon carcinoma. Followup indeterminate
liver mass.

EXAM:
CT ABDOMEN WITHOUT AND WITH CONTRAST
TECHNIQUE: Multidetector CT imaging of the abdomen was performed following the
standard protocol before and following the bolus administration of
intravenous contrast.
CONTRAST:  100mL OMNIPAQUE IOHEXOL 300 MG/ML  SOLN

[Series 3: arterial 25 sec 3.0 b40f · axial · arterial · 0.74mm/px · z∈[-204,-164]mm · 2 of 81 slices shown]
[im 14/81  soft-tissue]
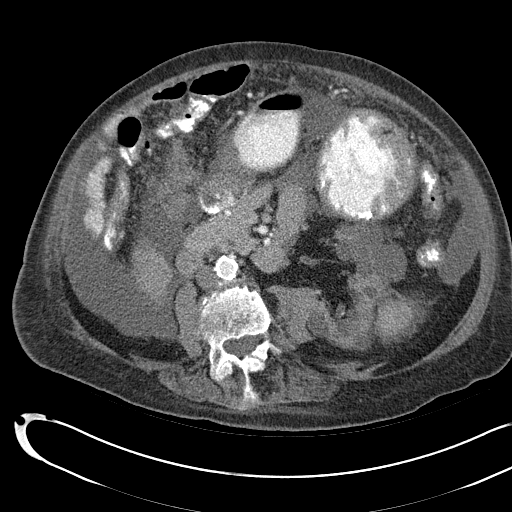
[im 27/81  soft-tissue]
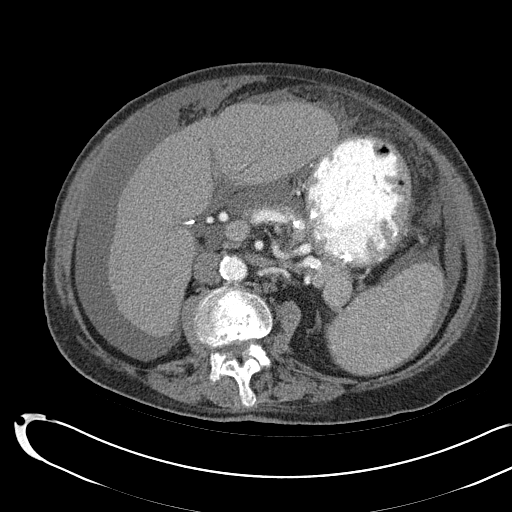

[Series 4: mpr arterial cor (id) · coronal · arterial · 0.48mm/px · 3 of 85 slices shown, 4 images]
[im 22/85  soft-tissue]
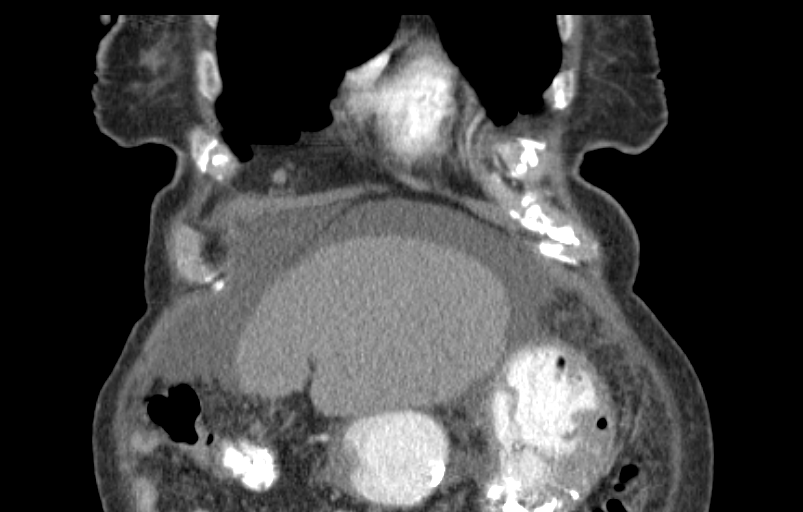
[im 43/85  soft-tissue]
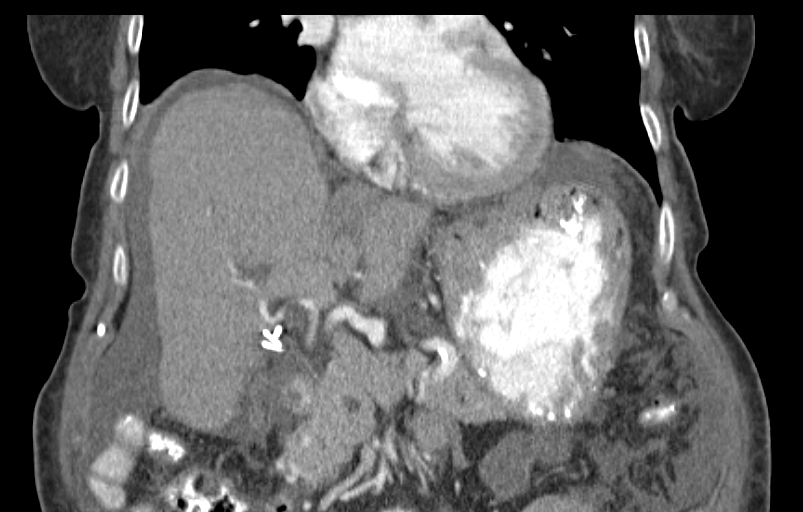
[im 43/85  bone]
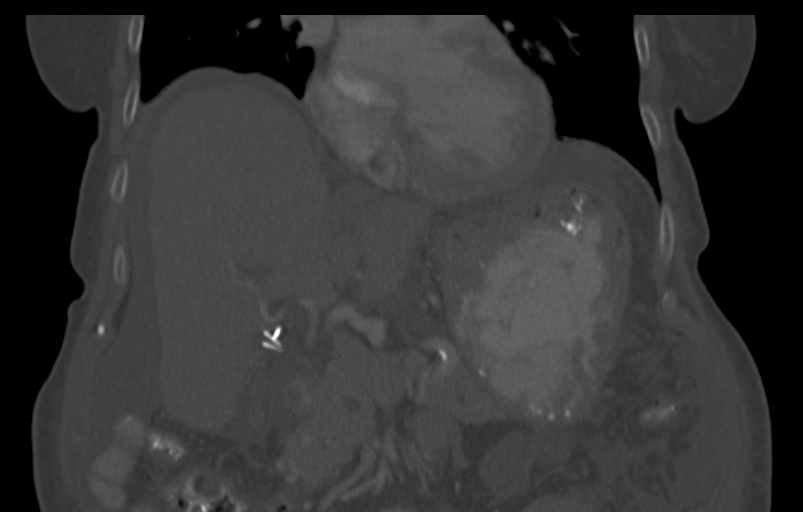
[im 64/85  soft-tissue]
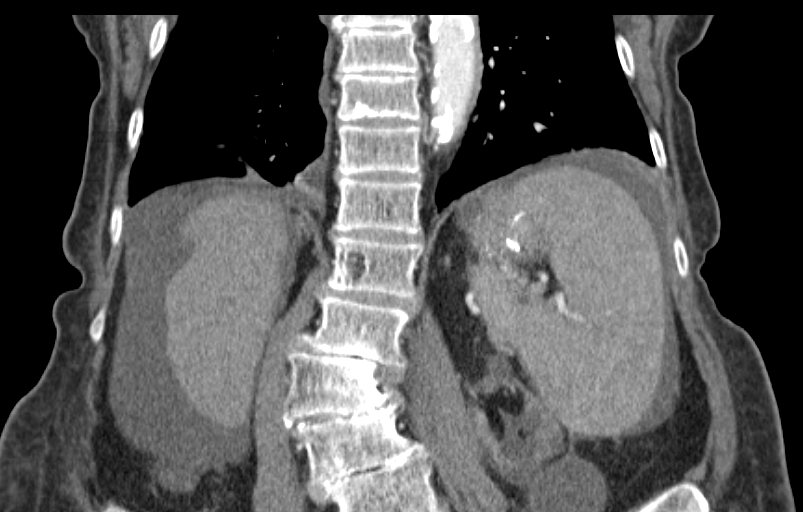

[Series 7: venous 60 sec 3.0 b40f · axial · portal-venous · 0.74mm/px · z∈[-203,-44]mm · 5 of 81 slices shown, 10 images]
[im 14/81  soft-tissue]
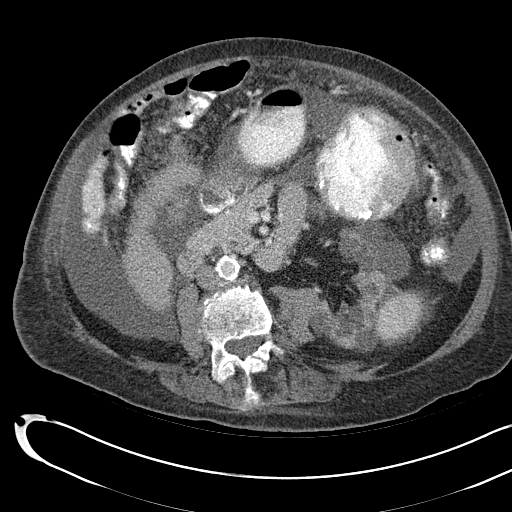
[im 14/81  bone]
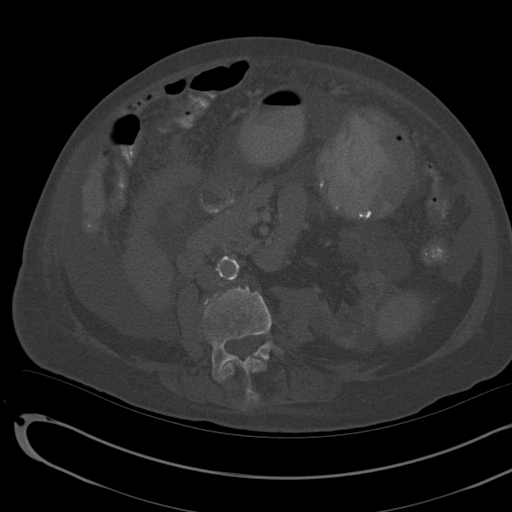
[im 27/81  soft-tissue]
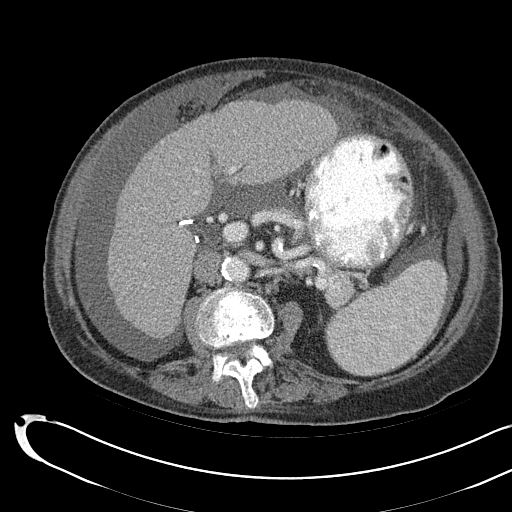
[im 27/81  lung]
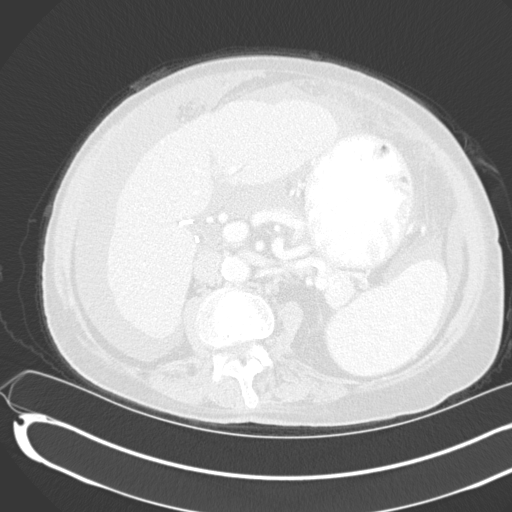
[im 41/81  soft-tissue]
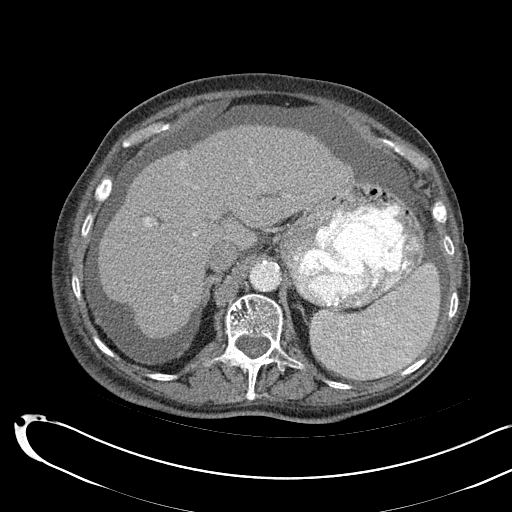
[im 41/81  lung]
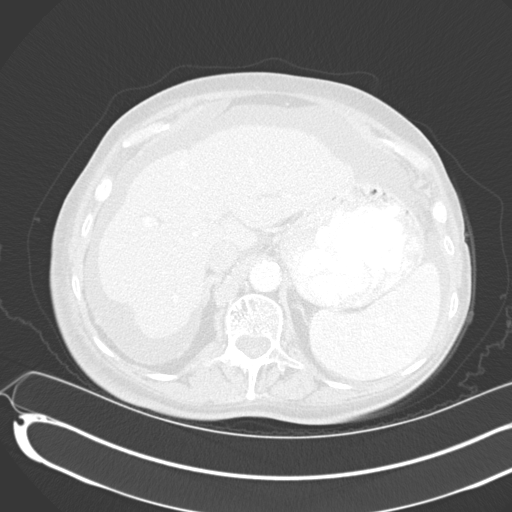
[im 54/81  soft-tissue]
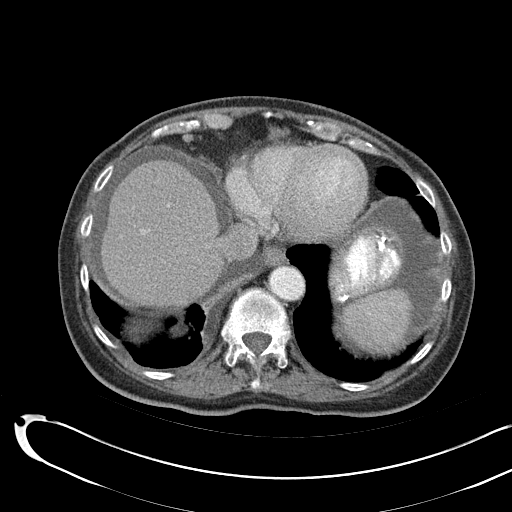
[im 54/81  lung]
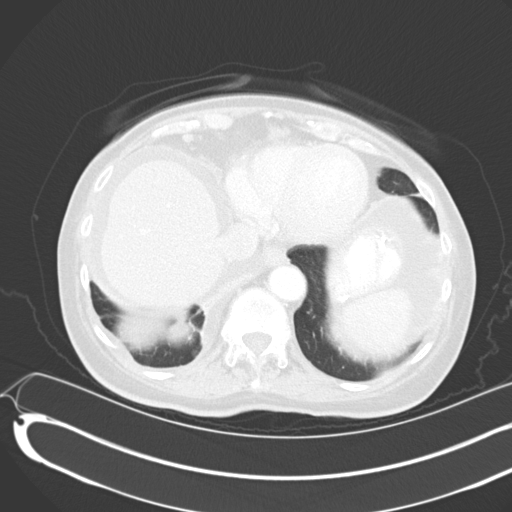
[im 67/81  soft-tissue]
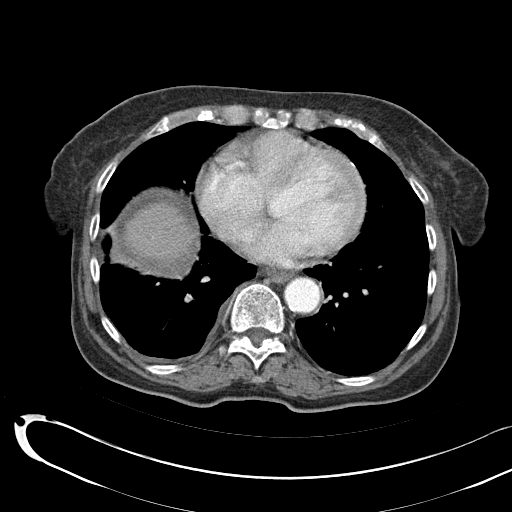
[im 67/81  lung]
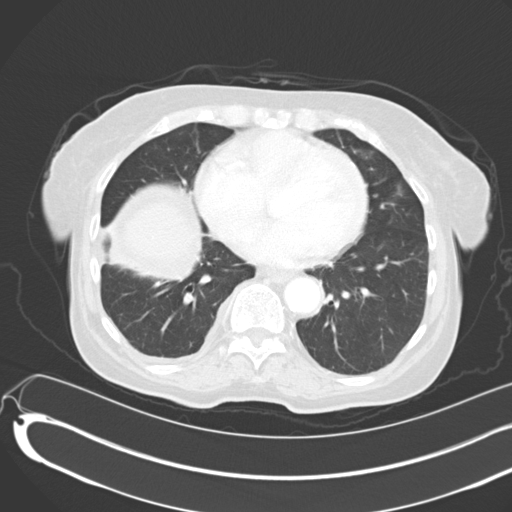

[10 of 46 positions shown; findings below may reference images not displayed]

FINDINGS: Hepatic cirrhosis again demonstrated. Mild splenomegaly is stable
and consistent with portal venous hypertension. There is mild to
moderate abdominal ascites which is new since previous study.

A 7 mm hypervascular lesion in segment 4A of the left hepatic lobe
remains stable. A small hepatocellular carcinoma cannot be excluded
in the setting of cirrhosis. No other hypervascular liver masses are
identified. A small cyst with peripheral calcification is seen in
the posterior right hepatic lobe. This is stable and consistent with
benign etiology.

The pancreas and adrenal glands are normal in appearance. Numerous
cysts are seen diffusely involving the left kidney. No evidence
hydronephrosis. Right kidney is not visualized within the abdomen.
No other soft tissue masses or lymphadenopathy identified within the
abdomen.

Wall thickening the gastric antrum shows no significant change, and
antral gastritis cannot be excluded. No evidence of gastric outlet
obstruction or dilated abdominal bowel loops. Severe lumbar spine
degenerative changes and scoliosis noted.
IMPRESSION: Stable 7 mm hypervascular lesion in segment 4A of the left hepatic
lobe. A small hepatocellular carcinoma cannot be excluded in the
setting of cirrhosis. Recommend continued followup by abdomen MRI or
CT in 6 months. This recommendation follows ACR consensus
guidelines: Managing Incidental Findings on Abdominal CT: White
Paper of the ACR Incidental Findings Committee. [HOSPITAL]
8181;[DATE]

New mild to moderate abdominal ascites.

Stable splenomegaly, consistent with portal venous hypertension.

## 2016-10-13 ENCOUNTER — Encounter: Payer: Self-pay | Admitting: *Deleted

## 2016-10-16 NOTE — Telephone Encounter (Signed)
This encounter was created in error - please disregard.

## 2017-02-17 ENCOUNTER — Telehealth: Payer: Self-pay | Admitting: Adult Health

## 2018-05-25 NOTE — Telephone Encounter (Signed)
deceased patient
